# Patient Record
Sex: Male | Born: 1960
Health system: Southern US, Community
[De-identification: ages and names within clinical notes are randomized; demographics above are authoritative.]

## PROBLEM LIST (undated history)

## (undated) DIAGNOSIS — F329 Major depressive disorder, single episode, unspecified: Secondary | ICD-10-CM

## (undated) DIAGNOSIS — M810 Age-related osteoporosis without current pathological fracture: Secondary | ICD-10-CM

## (undated) DIAGNOSIS — G709 Myoneural disorder, unspecified: Secondary | ICD-10-CM

## (undated) DIAGNOSIS — K219 Gastro-esophageal reflux disease without esophagitis: Secondary | ICD-10-CM

## (undated) DIAGNOSIS — F32A Depression, unspecified: Secondary | ICD-10-CM

## (undated) HISTORY — DX: Depression, unspecified: F32.A

## (undated) HISTORY — DX: Gastro-esophageal reflux disease without esophagitis: K21.9

## (undated) HISTORY — DX: Age-related osteoporosis without current pathological fracture: M81.0

## (undated) HISTORY — DX: Myoneural disorder, unspecified: G70.9

## (undated) HISTORY — DX: Major depressive disorder, single episode, unspecified: F32.9

## (undated) HISTORY — PX: COLON SURGERY: SHX602

## (undated) HISTORY — PX: ROTATOR CUFF REPAIR: SHX139

---

## 2001-04-22 ENCOUNTER — Emergency Department (HOSPITAL_COMMUNITY): Admission: EM | Admit: 2001-04-22 | Discharge: 2001-04-22 | Payer: Self-pay

## 2001-07-01 ENCOUNTER — Emergency Department (HOSPITAL_COMMUNITY): Admission: EM | Admit: 2001-07-01 | Discharge: 2001-07-01 | Payer: Self-pay | Admitting: Emergency Medicine

## 2001-07-01 ENCOUNTER — Encounter: Payer: Self-pay | Admitting: Emergency Medicine

## 2002-02-23 ENCOUNTER — Emergency Department (HOSPITAL_COMMUNITY): Admission: EM | Admit: 2002-02-23 | Discharge: 2002-02-23 | Payer: Self-pay | Admitting: Emergency Medicine

## 2002-05-09 ENCOUNTER — Emergency Department (HOSPITAL_COMMUNITY): Admission: EM | Admit: 2002-05-09 | Discharge: 2002-05-09 | Payer: Self-pay | Admitting: *Deleted

## 2002-05-09 ENCOUNTER — Encounter: Payer: Self-pay | Admitting: *Deleted

## 2002-06-03 ENCOUNTER — Emergency Department (HOSPITAL_COMMUNITY): Admission: EM | Admit: 2002-06-03 | Discharge: 2002-06-03 | Payer: Self-pay | Admitting: Emergency Medicine

## 2002-08-07 ENCOUNTER — Ambulatory Visit (HOSPITAL_BASED_OUTPATIENT_CLINIC_OR_DEPARTMENT_OTHER): Admission: RE | Admit: 2002-08-07 | Discharge: 2002-08-07 | Payer: Self-pay | Admitting: Orthopedic Surgery

## 2002-08-08 ENCOUNTER — Ambulatory Visit (HOSPITAL_BASED_OUTPATIENT_CLINIC_OR_DEPARTMENT_OTHER): Admission: RE | Admit: 2002-08-08 | Discharge: 2002-08-09 | Payer: Self-pay | Admitting: Orthopedic Surgery

## 2004-11-24 ENCOUNTER — Ambulatory Visit: Payer: Self-pay | Admitting: Nurse Practitioner

## 2004-12-23 ENCOUNTER — Ambulatory Visit: Payer: Self-pay | Admitting: *Deleted

## 2004-12-30 ENCOUNTER — Emergency Department (HOSPITAL_COMMUNITY): Admission: EM | Admit: 2004-12-30 | Discharge: 2004-12-30 | Payer: Self-pay | Admitting: Emergency Medicine

## 2006-05-07 ENCOUNTER — Ambulatory Visit: Payer: Self-pay | Admitting: Nurse Practitioner

## 2006-11-27 ENCOUNTER — Encounter: Admission: RE | Admit: 2006-11-27 | Discharge: 2006-11-27 | Payer: Self-pay | Admitting: Family Medicine

## 2008-04-06 ENCOUNTER — Emergency Department (HOSPITAL_COMMUNITY): Admission: EM | Admit: 2008-04-06 | Discharge: 2008-04-06 | Payer: Self-pay | Admitting: Emergency Medicine

## 2008-08-21 ENCOUNTER — Encounter: Admission: RE | Admit: 2008-08-21 | Discharge: 2008-08-21 | Payer: Self-pay | Admitting: Family Medicine

## 2010-03-06 ENCOUNTER — Encounter: Payer: Self-pay | Admitting: Family Medicine

## 2010-03-21 ENCOUNTER — Ambulatory Visit: Payer: Self-pay | Admitting: Family Medicine

## 2010-07-01 NOTE — Op Note (Signed)
NAME:  Thomas Spence, Thomas Spence                      ACCOUNT NO.:  [id number]   MEDICAL RECORD NO.:  [id number]                   PATIENT TYPE:  AMB   LOCATION:  DSC                                  FACILITY:  [facility]   PHYSICIAN:  Loreta Ave, M.D.              DATE OF BIRTH:  [birth year]   DATE OF PROCEDURE:  DATE OF DISCHARGE:                                 OPERATIVE REPORT   PREOPERATIVE DIAGNOSES:  1. Traumatic injury right ankle with synovitis.  2. Osteochondral fracture medial talar dome.   POSTOPERATIVE DIAGNOSES:  1. Traumatic injury right ankle with synovitis.  2. Osteochondral fracture medial talar dome.  3. Healed fracture without chondral breakdown, but with traumatic synovitis,     anteromedial and a meniscoid lesion anterolateral.   PROCEDURE:  1. Right ankle exam under anesthesia including obtaining fluoroscopic stress     views.  2. Arthroscopy with assessment of talar dome.  3. Synovectomy and excision meniscoid lesion.   SURGEON:  Loreta Ave, M.D.   ASSISTANT:  __________, Rochel Brome.   ANESTHESIA:  General   BLOOD LOSS:  Minimal   TOURNIQUET TIME:  40 minutes   SPECIMENS:  None.   CULTURES:  None.   COMPLICATIONS:  None.   DRESSINGS:  Soft compression.   DESCRIPTION OF PROCEDURE:  The patient brought to the operating room, placed  on the operating table in the supine position.  After adequate anesthesia  had been obtained the right ankle examined.  Absolutely full motion.  No  instability to tilt and drawer with fluoroscopic guidance.   Tourniquet leg holder applied and leg prepped and draped in the usual  sterile fashion.  Exsanguinated with elevation and Esmarch.  Tourniquet  inflated to 250 mmHg.  Anteromedial and anterolateral ankle portals avoiding  neurovascular structures.  Ankle entered with blunt obturator, distended and  inspected with the arthroscope introduced.  Abundant, anteromedial synovitis  interposed between the tibial talar  joint.  All of this debrided.  Anterolaterally there was a very stout meniscoid lesion bridging from the  tibia to in front of the fibula causing impingement in that area.  Excised  in its entirety.  Once that was complete, the entire ankle examined.  The  area of osteochondral lesion on the medial talar dome had no intraarticular  extension.  There was no instability, no step off no chondral breakdown and  nothing to warrant in situ drilling, fixation, or further debridement.  All  recesses including the posterior aspect thoroughly examined.  No other  findings appreciated.  Complete full motion without soft tissue impingement  anteriorly at completion.   The instruments were totally removed.  Portals and ankle injected with  Marcaine.  Portals closed with 4-0 Nylon.  Sterile compressive dressing  applied.  Anesthesia reversed.  Brought to the recovery room.  Tolerated  surgery well with no complications.  Loreta Ave, M.D.    DFM/MEDQ  D:  08/08/2002  T:  08/09/2002  Job:  [id number]

## 2012-10-21 ENCOUNTER — Ambulatory Visit (INDEPENDENT_AMBULATORY_CARE_PROVIDER_SITE_OTHER): Payer: PRIVATE HEALTH INSURANCE | Admitting: Family Medicine

## 2012-10-21 VITALS — BP 124/88 | HR 76 | Temp 98.5°F | Resp 18 | Ht 74.0 in | Wt 230.0 lb

## 2012-10-21 DIAGNOSIS — I10 Essential (primary) hypertension: Secondary | ICD-10-CM | POA: Insufficient documentation

## 2012-10-21 DIAGNOSIS — G894 Chronic pain syndrome: Secondary | ICD-10-CM

## 2012-10-21 MED ORDER — HYDROCHLOROTHIAZIDE 12.5 MG PO TABS: 12.5000 mg | ORAL_TABLET | Freq: Every day | ORAL | Status: DC

## 2012-10-21 MED ORDER — DICLOFENAC SODIUM 75 MG PO TBEC: 75.0000 mg | DELAYED_RELEASE_TABLET | Freq: Two times a day (BID) | ORAL | Status: DC | PRN

## 2012-10-21 MED ORDER — OXYCODONE-ACETAMINOPHEN 5-325 MG PO TABS: 1.0000 | ORAL_TABLET | Freq: Every day | ORAL | Status: DC | PRN

## 2012-10-21 MED ORDER — TIZANIDINE HCL 4 MG PO TABS: 4.0000 mg | ORAL_TABLET | Freq: Three times a day (TID) | ORAL | Status: DC | PRN

## 2012-10-21 NOTE — Patient Instructions (Addendum)
We are going to try changing you to hydrochlorothiazide (HCTZ) for your blood pressure.  This may help with your swelling.   Keep an eye on your BP.  We are going to start on HCTZ 12.5 mg, but can increase to 25 if needed  We will work on getting you referred to a pain clinic to manage your chronic pain medications.  I will be in touch with your labs
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
## 2012-10-22 ENCOUNTER — Telehealth: Payer: Self-pay | Admitting: *Deleted

## 2012-10-22 ENCOUNTER — Encounter: Payer: Self-pay | Admitting: Family Medicine

## 2012-10-22 LAB — COMPREHENSIVE METABOLIC PANEL
Albumin: 3.9 g/dL (ref 3.5–5.2)
Alkaline Phosphatase: 59 U/L (ref 39–117)
BUN: 9 mg/dL (ref 6–23)
Creat: 1.2 mg/dL (ref 0.50–1.35)
Glucose, Bld: 92 mg/dL (ref 70–99)
Potassium: 4.6 mEq/L (ref 3.5–5.3)
Sodium: 141 mEq/L (ref 135–145)
Total Bilirubin: 0.8 mg/dL (ref 0.3–1.2)
Total Protein: 6.3 g/dL (ref 6.0–8.3)

## 2012-10-22 NOTE — Telephone Encounter (Signed)
Telephone for patient has been disconnected. Tried to call patient regarding recent normal lab results and to inform patient to switch to new blood pressure medication (HCTZ). Letter will be mailed out to patient explaining changes.

## 2012-11-08 ENCOUNTER — Other Ambulatory Visit: Payer: Self-pay | Admitting: Family Medicine

## 2012-11-14 ENCOUNTER — Other Ambulatory Visit: Payer: Self-pay | Admitting: Family Medicine

## 2012-11-22 ENCOUNTER — Ambulatory Visit (INDEPENDENT_AMBULATORY_CARE_PROVIDER_SITE_OTHER): Payer: PRIVATE HEALTH INSURANCE | Admitting: Family Medicine

## 2012-11-22 VITALS — BP 126/88 | HR 67 | Temp 98.2°F | Resp 16 | Ht 74.75 in | Wt 229.6 lb

## 2012-11-22 DIAGNOSIS — G894 Chronic pain syndrome: Secondary | ICD-10-CM

## 2012-11-22 DIAGNOSIS — I10 Essential (primary) hypertension: Secondary | ICD-10-CM

## 2012-11-22 MED ORDER — ALPRAZOLAM 1 MG PO TABS: 1.0000 mg | ORAL_TABLET | Freq: Every day | ORAL | Status: DC

## 2012-11-22 MED ORDER — TIZANIDINE HCL 4 MG PO TABS: 4.0000 mg | ORAL_TABLET | Freq: Three times a day (TID) | ORAL | Status: DC | PRN

## 2012-11-22 MED ORDER — OXYCODONE-ACETAMINOPHEN 5-325 MG PO TABS: 1.0000 | ORAL_TABLET | Freq: Every day | ORAL | Status: DC | PRN

## 2012-11-22 NOTE — Patient Instructions (Signed)
Good to see you again today.  Take care!

## 2012-11-22 NOTE — Progress Notes (Signed)
[facility] [address], [place] [address] [phone] 0000  Date:  11/22/2012   Name:  Thomas Spence   DOB:  06-15-1960   MRN:  [id number]  PCP:  No primary provider on file.    Chief Complaint: Back Pain, Neck Pain, Ankle Pain and Medication Refill   History of Present Illness:  Thomas Spence is a 52 y.o. very pleasant male patient who presents with the following:  Seen by myself one month ago to establish care after discharge from his prior PCP.  We changed from amlodipine to HCTZ after his last visit due to ankle swelling He plans to establish with the [facility] community care clinic and will see them early next month.  He continues to "have just the same chronic pain" in his ankles, knees and back and needs a RF of his oxycodone, xanax and zanaflex.   He did see improvement in his swelling when he changed to HCTZ from norvasc. His ankles do still hurt and crack sometimes.   His BP is looking good as well  He recently got over a cold- he is now feeling much better.   He did have [facility] flu shot at [facility] already this year per his report  Patient Active Problem List   Diagnosis Date Noted  . Hypertension 10/21/2012  . Chronic pain syndrome 10/21/2012    Past Medical History  Diagnosis Date  . Depression   . GERD (gastroesophageal reflux disease)   . Neuromuscular disorder   . Osteoporosis     Past Surgical History  Procedure Laterality Date  . Colon surgery    . Rotator cuff repair      History  Substance Use Topics  . Smoking status: Current Every Day Smoker -- 10.00 packs/day for 25 years    Types: Cigarettes  . Smokeless tobacco: Not on file  . Alcohol Use: No    Family History  Problem Relation Age of Onset  . Multiple sclerosis Mother   . Osteoporosis Mother   . Arthritis Mother     Rheumatoid  . Diabetes Father     Allergies  Allergen Reactions  . Losartan     Made my chest hurt    Medication list has been reviewed  and updated.  Current Outpatient Prescriptions on File Prior to Visit  Medication Sig Dispense Refill  . ALPRAZolam (XANAX) 1 MG tablet Take 1 mg by mouth at bedtime.      . diclofenac (VOLTAREN) 75 MG EC tablet Take 1 tablet (75 mg total) by mouth 2 (two) times daily as needed.  60 tablet  2  . hydrochlorothiazide (HYDRODIURIL) 12.5 MG tablet Take 1 tablet (12.5 mg total) by mouth daily. May take 25 mg a day if needed  60 tablet  3  . NEXIUM 40 MG capsule take 1 capsule by mouth once daily  30 capsule  2  . oxyCODONE-acetaminophen (PERCOCET/ROXICET) 5-325 MG per tablet Take 1 tablet by mouth daily as needed for pain.  30 tablet  0  . tiZANidine (ZANAFLEX) 4 MG tablet Take 1 tablet (4 mg total) by mouth every 8 (eight) hours as needed.  60 tablet  0   No current facility-administered medications on file prior to visit.    Review of Systems:  As per HPI- otherwise negative.   Physical Examination: Filed Vitals:   11/22/12 1051  BP: 126/88  Pulse: 67  Temp: 98.2 F (36.8 C)  Resp: 16   Filed Vitals:  11/22/12 1051  Height: 6' 2.75" (1.899 m)  Weight: 229 lb 9.6 oz (104.146 kg)   Body mass index is 28.88 kg/(m^2). Ideal Body Weight: Weight in (lb) to have BMI = 25: 198.3  GEN: WDWN, NAD, Non-toxic, A & O x 3 HEENT: Atraumatic, Normocephalic. Neck supple. No masses, No LAD. Ears and Nose: No external deformity. CV: RRR, No M/G/R. No JVD. No thrill. No extra heart sounds. PULM: CTA B, no wheezes, crackles, rhonchi. No retractions. No resp. distress. No accessory muscle use. ABD: S, NT, ND, +BS. No rebound. No HSM. EXTR: No c/c/e.  Ankles look great today NEURO Normal gait for him- he does use a cane PSYCH: Normally interactive. Conversant. Not depressed or anxious appearing.  Calm demeanor.    Assessment and Plan: Chronic pain syndrome - Plan: oxyCODONE-acetaminophen (PERCOCET/ROXICET) 5-325 MG per tablet, tiZANidine (ZANAFLEX) 4 MG tablet, ALPRAZolam (XANAX) 1 MG  tablet  HTN (hypertension)   BP looks fine on his new medication.  Recommended a BMP soon- he prefers to wait until next month because his ride home is waiting on him.   Refilled his chronic pain medications.    Signed Abbe Amsterdam, MD

## 2012-12-16 ENCOUNTER — Ambulatory Visit: Payer: Self-pay | Admitting: Internal Medicine

## 2012-12-23 ENCOUNTER — Ambulatory Visit (INDEPENDENT_AMBULATORY_CARE_PROVIDER_SITE_OTHER): Payer: Medicare Other | Admitting: Family Medicine

## 2012-12-23 VITALS — BP 118/70 | HR 80 | Temp 98.6°F | Resp 18 | Wt 229.0 lb

## 2012-12-23 DIAGNOSIS — G894 Chronic pain syndrome: Secondary | ICD-10-CM

## 2012-12-23 DIAGNOSIS — K044 Acute apical periodontitis of pulpal origin: Secondary | ICD-10-CM

## 2012-12-23 DIAGNOSIS — K047 Periapical abscess without sinus: Secondary | ICD-10-CM

## 2012-12-23 DIAGNOSIS — K029 Dental caries, unspecified: Secondary | ICD-10-CM

## 2012-12-23 MED ORDER — AMOXICILLIN 875 MG PO TABS: 875.0000 mg | ORAL_TABLET | Freq: Two times a day (BID) | ORAL | Status: DC

## 2012-12-23 MED ORDER — ALPRAZOLAM 1 MG PO TABS: 1.0000 mg | ORAL_TABLET | Freq: Every day | ORAL | Status: DC

## 2012-12-23 MED ORDER — OXYCODONE-ACETAMINOPHEN 5-325 MG PO TABS: 1.0000 | ORAL_TABLET | Freq: Every day | ORAL | Status: DC | PRN

## 2012-12-23 NOTE — Progress Notes (Signed)
Subjective: 52 year old male who is here for 2 things. He wants more pain and nerve medicines. Says he takes his pain pills one daily. Dr. Dallas Schimke last gave him some a month ago he says he is on these for chronic pain. He apparently is on disability, but when asked what he is on disability for he stumbled for an answer before applying he thought sports injuries. He hurts in his neck back and legs. He has been on disability since 2008. He also complains of dental pain in his left upper incisor. He says the pain comes and go but he thinks it is infected. It hurts in the dome above the tooth. He has had a bad tooth since he was incarcerated, having been released in 2000. He is scared of dentist.  Objective: Flat affect, somewhat overweight male in no acute distress. He has a broken off there are 2 on his left upper incisor. The rest of his teeth don't look too bad. He is tender in the gum margin above the tooth. Extensive exam not done at this time.  Assessment: Chronic pain syndrome with chronic controlled substance use Dental pain  Plan: Check his drug use history.  It would appear that he is been very consistent in getting just one prescription a month however our policy is not to be chronic pain medicine prescribers. I will explain that to him.  Will give him a round of antibiotics but he must go to a dentist before we do further dental related meds.

## 2012-12-23 NOTE — Patient Instructions (Signed)
[facility] Policy for Prescribing Controlled Substances (Revised 12/2011) 1. Prescriptions for controlled substances will be filled by ONE provider at [facility] with whom you have established and developed a plan for your care, including follow-up. 2. You are encouraged to schedule an appointment with your prescriber at our appointment center for follow-up visits whenever possible. 3. If you request a prescription for the controlled substance while at [facility] for an acute problem (with someone other than your regular prescriber), you MAY be given a ONE-TIME prescription for a 30-day supply of the controlled substance, to allow time for you to return to see your regular prescriber for additional prescriptions.   Please note the above policy statement from the practice regarding controlled substances. We are not a pain clinic, and will not continue prescribing regular narcotic medications. Because you have been taking them very consistently I will give him to you today, but we'll make a referral for a pain clinic. You will need to go to the pain clinic in the future for chronic pain medication management  Take the antibiotic one twice daily for your tooth. You need to see a dentist very soon.

## 2012-12-23 NOTE — Progress Notes (Deleted)
  Subjective:    Patient ID: Thomas Spence, male    DOB: [birth year], 52 y.o.   MRN: [id number]  HPI    Review of Systems     Objective:   Physical Exam        Assessment & Plan:

## 2013-01-07 ENCOUNTER — Ambulatory Visit: Payer: Self-pay | Admitting: Internal Medicine

## 2013-02-18 ENCOUNTER — Telehealth: Payer: Self-pay

## 2013-03-24 ENCOUNTER — Ambulatory Visit: Payer: PRIVATE HEALTH INSURANCE | Attending: Internal Medicine | Admitting: Internal Medicine

## 2013-03-24 ENCOUNTER — Encounter: Payer: Self-pay | Admitting: Internal Medicine

## 2013-03-24 VITALS — BP 133/84 | HR 99 | Temp 98.4°F | Resp 16 | Ht 76.0 in | Wt 226.0 lb

## 2013-03-24 DIAGNOSIS — M545 Low back pain, unspecified: Secondary | ICD-10-CM

## 2013-03-24 DIAGNOSIS — G8929 Other chronic pain: Secondary | ICD-10-CM

## 2013-03-24 DIAGNOSIS — Z23 Encounter for immunization: Secondary | ICD-10-CM

## 2013-03-24 DIAGNOSIS — F411 Generalized anxiety disorder: Secondary | ICD-10-CM

## 2013-03-24 DIAGNOSIS — Z139 Encounter for screening, unspecified: Secondary | ICD-10-CM

## 2013-03-24 DIAGNOSIS — Z008 Encounter for other general examination: Secondary | ICD-10-CM | POA: Insufficient documentation

## 2013-03-24 DIAGNOSIS — I10 Essential (primary) hypertension: Secondary | ICD-10-CM

## 2013-03-24 DIAGNOSIS — K219 Gastro-esophageal reflux disease without esophagitis: Secondary | ICD-10-CM

## 2013-03-24 DIAGNOSIS — G709 Myoneural disorder, unspecified: Secondary | ICD-10-CM | POA: Insufficient documentation

## 2013-03-24 DIAGNOSIS — F3289 Other specified depressive episodes: Secondary | ICD-10-CM | POA: Insufficient documentation

## 2013-03-24 DIAGNOSIS — G894 Chronic pain syndrome: Secondary | ICD-10-CM

## 2013-03-24 DIAGNOSIS — Z79899 Other long term (current) drug therapy: Secondary | ICD-10-CM | POA: Insufficient documentation

## 2013-03-24 DIAGNOSIS — F17200 Nicotine dependence, unspecified, uncomplicated: Secondary | ICD-10-CM | POA: Insufficient documentation

## 2013-03-24 DIAGNOSIS — F329 Major depressive disorder, single episode, unspecified: Secondary | ICD-10-CM | POA: Insufficient documentation

## 2013-03-24 LAB — CBC WITH DIFFERENTIAL/PLATELET
BASOS ABS: 0 10*3/uL (ref 0.0–0.1)
BASOS PCT: 0 % (ref 0–1)
EOS ABS: 0.1 10*3/uL (ref 0.0–0.7)
EOS PCT: 1 % (ref 0–5)
HCT: 42.5 % (ref 39.0–52.0)
Hemoglobin: 15.4 g/dL (ref 13.0–17.0)
LYMPHS PCT: 27 % (ref 12–46)
Lymphs Abs: 2.8 10*3/uL (ref 0.7–4.0)
MCH: 32.7 pg (ref 26.0–34.0)
MCHC: 36.2 g/dL — ABNORMAL HIGH (ref 30.0–36.0)
MCV: 90.2 fL (ref 78.0–100.0)
MONO ABS: 0.4 10*3/uL (ref 0.1–1.0)
Monocytes Relative: 4 % (ref 3–12)
NEUTROS PCT: 68 % (ref 43–77)
Neutro Abs: 7 10*3/uL (ref 1.7–7.7)
PLATELETS: 279 10*3/uL (ref 150–400)
RBC: 4.71 MIL/uL (ref 4.22–5.81)
RDW: 13.9 % (ref 11.5–15.5)
WBC: 10.3 10*3/uL (ref 4.0–10.5)

## 2013-03-24 LAB — LIPID PANEL
CHOL/HDL RATIO: 3.4 ratio
Cholesterol: 203 mg/dL — ABNORMAL HIGH (ref 0–200)
HDL: 60 mg/dL (ref >39)
LDL CALC: 118 mg/dL — AB (ref 0–99)
Triglycerides: 125 mg/dL (ref <150)
VLDL: 25 mg/dL (ref 0–40)

## 2013-03-24 LAB — COMPLETE METABOLIC PANEL WITH GFR
ALK PHOS: 52 U/L (ref 39–117)
ALT: 45 U/L (ref 0–53)
AST: 55 U/L — AB (ref 0–37)
Albumin: 4 g/dL (ref 3.5–5.2)
BUN: 10 mg/dL (ref 6–23)
CALCIUM: 8.7 mg/dL (ref 8.4–10.5)
CO2: 26 mEq/L (ref 19–32)
CREATININE: 1.17 mg/dL (ref 0.50–1.35)
Chloride: 104 mEq/L (ref 96–112)
GFR, EST NON AFRICAN AMERICAN: 72 mL/min
GFR, Est African American: 83 mL/min
Glucose, Bld: 89 mg/dL (ref 70–99)
Potassium: 3.8 mEq/L (ref 3.5–5.3)
Sodium: 138 mEq/L (ref 135–145)
Total Bilirubin: 0.7 mg/dL (ref 0.2–1.2)
Total Protein: 6.3 g/dL (ref 6.0–8.3)

## 2013-03-24 LAB — TSH: TSH: 0.678 u[IU]/mL (ref 0.350–4.500)

## 2013-03-24 MED ORDER — TRAMADOL HCL 50 MG PO TABS: 50.0000 mg | ORAL_TABLET | Freq: Three times a day (TID) | ORAL | Status: DC | PRN

## 2013-03-24 MED ORDER — PANTOPRAZOLE SODIUM 40 MG PO TBEC: 40.0000 mg | DELAYED_RELEASE_TABLET | Freq: Every day | ORAL | Status: DC

## 2013-03-24 MED ORDER — HYDROCHLOROTHIAZIDE 12.5 MG PO TABS: 12.5000 mg | ORAL_TABLET | Freq: Every day | ORAL | Status: DC

## 2013-03-24 NOTE — Progress Notes (Signed)
Patient Demographics  Thomas Spence, is a 53 y.o. male  WUJ:[id number]  NFA:[id number]  DOB - 1960-12-24  CC:  Chief Complaint  Patient presents with  . Establish Care  . Hypertension  . Gastrophageal Reflux       HPI: Thomas Spence is a 53 y.o. male here today to establish medical care. Patient has history of hypertension GERD chronic pain syndrome low back pain, anxiety/depression following up with the psychiatrist, patient is requesting refill on his blood pressure medication also asking for pain medication, has been using diclofenac and ibuprofen, denies any fever chills chest pain shortness of breath denies any numbness weakness. Patient has No headache, No chest pain, No abdominal pain - No Nausea, No new weakness tingling or numbness, No Cough - SOB.  Allergies  Allergen Reactions  . Losartan     Made my chest hurt   Past Medical History  Diagnosis Date  . Depression   . GERD (gastroesophageal reflux disease)   . Neuromuscular disorder   . Osteoporosis    Current Outpatient Prescriptions on File Prior to Visit  Medication Sig Dispense Refill  . ALPRAZolam (XANAX) 1 MG tablet Take 1 tablet (1 mg total) by mouth at bedtime.  30 tablet  0  . amoxicillin (AMOXIL) 875 MG tablet Take 1 tablet (875 mg total) by mouth 2 (two) times daily.  14 tablet  0  . diclofenac (VOLTAREN) 75 MG EC tablet Take 1 tablet (75 mg total) by mouth 2 (two) times daily as needed.  60 tablet  2  . oxyCODONE-acetaminophen (PERCOCET/ROXICET) 5-325 MG per tablet Take 1 tablet by mouth daily as needed.  30 tablet  0  . tiZANidine (ZANAFLEX) 4 MG tablet Take 1 tablet (4 mg total) by mouth every 8 (eight) hours as needed.  60 tablet  0   No current facility-administered medications on file prior to visit.   Family History  Problem Relation Age of Onset  . Multiple sclerosis Mother   . Osteoporosis Mother   . Arthritis Mother     Rheumatoid  . Hypertension Mother   . Hyperlipidemia  Mother   . Diabetes Father   . Hypertension Father   . Hyperlipidemia Father    History   Social History  . Marital Status: Divorced    Spouse Name: N/A    Number of Children: N/A  . Years of Education: N/A   Occupational History  . Not on file.   Social History Main Topics  . Smoking status: Current Every Day Smoker -- 10.00 packs/day for 25 years    Types: Cigarettes  . Smokeless tobacco: Not on file     Comment: states he cut back to 2 cigarettes/week  . Alcohol Use: No  . Drug Use: No  . Sexual Activity: Not on file   Other Topics Concern  . Not on file   Social History Narrative  . No narrative on file    Review of Systems: Constitutional: Negative for fever, chills, diaphoresis, activity change, appetite change and fatigue. HENT: Negative for ear pain, nosebleeds, congestion, facial swelling, rhinorrhea, neck pain, neck stiffness and ear discharge.  Eyes: Negative for pain, discharge, redness, itching and visual disturbance. Respiratory: Negative for cough, choking, chest tightness, shortness of breath, wheezing and stridor.  Cardiovascular: Negative for chest pain, palpitations and leg swelling. Gastrointestinal: Negative for abdominal distention. Genitourinary: Negative for dysuria, urgency, frequency, hematuria, flank pain, decreased urine volume, difficulty urinating and dyspareunia.  Musculoskeletal: Positive for back pain, joint  swelling, arthralgia and gait problem. Neurological: Negative for dizziness, tremors, seizures, syncope, facial asymmetry, speech difficulty, weakness, light-headedness, numbness and headaches.  Hematological: Negative for adenopathy. Does not bruise/bleed easily. Psychiatric/Behavioral: Negative for hallucinations, behavioral problems, confusion, dysphoric mood, decreased concentration and agitation.    Objective:   Filed Vitals:   03/24/13 1029  BP: 133/84  Pulse: 99  Temp: 98.4 F (36.9 C)  Resp: 16    Physical  Exam: Constitutional: Patient appears well-developed and well-nourished. No distress. HENT: Normocephalic, atraumatic, External right and left ear normal. Oropharynx is clear and moist.  Eyes: Conjunctivae and EOM are normal. PERRLA, no scleral icterus. Neck: Normal ROM. Neck supple. No JVD. No tracheal deviation. No thyromegaly. CVS: RRR, S1/S2 +, no murmurs, no gallops, no carotid bruit.  Pulmonary: Effort and breath sounds normal, no stridor, rhonchi, wheezes, rales.  Abdominal: Soft. BS +, no distension, tenderness, rebound or guarding.  Musculoskeletal: Normal range of motion. No edema , minimal lower spinal enderness. Equal strength in both extremities.  Neuro: Alert. Normal reflexes, muscle tone coordination. No cranial nerve deficit. Skin: Skin is warm and dry. No rash noted. Not diaphoretic. No erythema. No pallor. Psychiatric: Normal mood and affect. Behavior, judgment, thought content normal.  No results found for this basename: WBC,  HGB,  HCT,  MCV,  PLT   Lab Results  Component Value Date   CREATININE 1.20 10/21/2012   BUN 9 10/21/2012   NA 141 10/21/2012   K 4.6 10/21/2012   CL 106 10/21/2012   CO2 28 10/21/2012    No results found for this basename: HGBA1C   Lipid Panel  No results found for this basename: chol,  trig,  hdl,  cholhdl,  vldl,  ldlcalc       Assessment and plan:   1. Hypertension Continue with hydrochlorothiazide. - COMPLETE METABOLIC PANEL WITH GFR  2. Chronic pain syndrome/Chronic low back pain  - traMADol (ULTRAM) 50 MG tablet; Take 1 tablet (50 mg total) by mouth every 8 (eight) hours as needed for moderate pain.  Dispense: 60 tablet; Refill: 0 - Ambulatory referral to Pain Clinic  3. GERD (gastroesophageal reflux disease) Started patient on Protonix, also advised for lifestyle modification.. - pantoprazole (PROTONIX) 40 MG tablet; Take 1 tablet (40 mg total) by mouth daily.   Dispense: 30 tablet; Refill: 3  4. Anxiety state, unspecified Patient  is following up with psychiatrist.  6. Need for Tdap vaccination  7. Needs flu shot  8. Screening  - CBC with Differential - TSH - Lipid panel - Vit D  25 hydroxy (rtn osteoporosis monitoring)       Health Maintenance -Colonoscopy: Patient is up-to-date  -Vaccinations:  -TdAP  -Influenza  Return in about 6 weeks (around 05/05/2013).  Doris CheadleADVANI, Zanetta Dehaan, MD

## 2013-03-24 NOTE — Progress Notes (Signed)
Pt here to establish care Hx HTN,GERD and chronic pain Walks with cane assist Taking HCTZ 12.5 mg daily Need T-Dap

## 2013-03-25 LAB — VITAMIN D 25 HYDROXY (VIT D DEFICIENCY, FRACTURES): VIT D 25 HYDROXY: 20 ng/mL — AB (ref 30–89)

## 2013-03-28 ENCOUNTER — Other Ambulatory Visit: Payer: Self-pay | Admitting: Family Medicine

## 2013-03-28 DIAGNOSIS — G4700 Insomnia, unspecified: Secondary | ICD-10-CM

## 2013-05-05 ENCOUNTER — Ambulatory Visit: Payer: Medicare Other | Admitting: Internal Medicine

## 2013-07-09 ENCOUNTER — Ambulatory Visit: Payer: PRIVATE HEALTH INSURANCE | Admitting: Internal Medicine

## 2013-09-01 ENCOUNTER — Ambulatory Visit: Payer: PRIVATE HEALTH INSURANCE | Admitting: Internal Medicine

## 2013-09-22 ENCOUNTER — Other Ambulatory Visit: Payer: Self-pay | Admitting: *Deleted

## 2013-09-22 ENCOUNTER — Other Ambulatory Visit: Payer: Self-pay

## 2013-09-22 DIAGNOSIS — K219 Gastro-esophageal reflux disease without esophagitis: Secondary | ICD-10-CM

## 2013-09-22 MED ORDER — PANTOPRAZOLE SODIUM 40 MG PO TBEC: 40.0000 mg | DELAYED_RELEASE_TABLET | Freq: Every day | ORAL | Status: DC

## 2013-09-22 NOTE — Telephone Encounter (Signed)
protonix called into [facility].

## 2013-09-26 ENCOUNTER — Ambulatory Visit: Payer: PRIVATE HEALTH INSURANCE | Admitting: Internal Medicine

## 2013-11-11 ENCOUNTER — Encounter: Payer: Self-pay | Admitting: Internal Medicine

## 2013-11-11 ENCOUNTER — Ambulatory Visit: Payer: PRIVATE HEALTH INSURANCE | Attending: Internal Medicine | Admitting: Internal Medicine

## 2013-11-11 VITALS — BP 130/80 | HR 77 | Temp 98.0°F | Resp 16 | Wt 216.0 lb

## 2013-11-11 DIAGNOSIS — K219 Gastro-esophageal reflux disease without esophagitis: Secondary | ICD-10-CM | POA: Diagnosis not present

## 2013-11-11 DIAGNOSIS — F3289 Other specified depressive episodes: Secondary | ICD-10-CM | POA: Insufficient documentation

## 2013-11-11 DIAGNOSIS — G894 Chronic pain syndrome: Secondary | ICD-10-CM | POA: Diagnosis not present

## 2013-11-11 DIAGNOSIS — F329 Major depressive disorder, single episode, unspecified: Secondary | ICD-10-CM | POA: Insufficient documentation

## 2013-11-11 DIAGNOSIS — Z79899 Other long term (current) drug therapy: Secondary | ICD-10-CM | POA: Insufficient documentation

## 2013-11-11 DIAGNOSIS — F17200 Nicotine dependence, unspecified, uncomplicated: Secondary | ICD-10-CM | POA: Insufficient documentation

## 2013-11-11 DIAGNOSIS — I10 Essential (primary) hypertension: Secondary | ICD-10-CM

## 2013-11-11 DIAGNOSIS — Z23 Encounter for immunization: Secondary | ICD-10-CM | POA: Diagnosis not present

## 2013-11-11 DIAGNOSIS — G709 Myoneural disorder, unspecified: Secondary | ICD-10-CM | POA: Insufficient documentation

## 2013-11-11 DIAGNOSIS — Z833 Family history of diabetes mellitus: Secondary | ICD-10-CM | POA: Diagnosis not present

## 2013-11-11 DIAGNOSIS — Z791 Long term (current) use of non-steroidal anti-inflammatories (NSAID): Secondary | ICD-10-CM | POA: Diagnosis not present

## 2013-11-11 LAB — COMPLETE METABOLIC PANEL WITH GFR
ALK PHOS: 57 U/L (ref 39–117)
ALT: 15 U/L (ref 0–53)
AST: 14 U/L (ref 0–37)
Albumin: 4.3 g/dL (ref 3.5–5.2)
BUN: 11 mg/dL (ref 6–23)
CHLORIDE: 107 meq/L (ref 96–112)
CO2: 23 mEq/L (ref 19–32)
Calcium: 9.5 mg/dL (ref 8.4–10.5)
Creat: 1.52 mg/dL — ABNORMAL HIGH (ref 0.50–1.35)
GFR, Est African American: 60 mL/min
GFR, Est Non African American: 52 mL/min — ABNORMAL LOW
Glucose, Bld: 68 mg/dL — ABNORMAL LOW (ref 70–99)
POTASSIUM: 4.8 meq/L (ref 3.5–5.3)
SODIUM: 143 meq/L (ref 135–145)
TOTAL PROTEIN: 6.8 g/dL (ref 6.0–8.3)
Total Bilirubin: 0.7 mg/dL (ref 0.2–1.2)

## 2013-11-11 LAB — HEMOGLOBIN A1C
Hgb A1c MFr Bld: 5.6 % (ref <5.7)
Mean Plasma Glucose: 114 mg/dL (ref <117)

## 2013-11-11 MED ORDER — GABAPENTIN 100 MG PO CAPS: 100.0000 mg | ORAL_CAPSULE | Freq: Three times a day (TID) | ORAL | Status: DC

## 2013-11-11 NOTE — Progress Notes (Signed)
Patient here for follow up on his HTN and gerd 

## 2013-11-11 NOTE — Patient Instructions (Signed)
DASH Eating Plan °DASH stands for "Dietary Approaches to Stop Hypertension." The DASH eating plan is a healthy eating plan that has been shown to reduce high blood pressure (hypertension). Additional health benefits may include reducing the risk of type 2 diabetes mellitus, heart disease, and stroke. The DASH eating plan may also help with weight loss. °WHAT DO I NEED TO KNOW ABOUT THE DASH EATING PLAN? °For the DASH eating plan, you will follow these general guidelines: °· Choose foods with a percent daily value for sodium of less than 5% (as listed on the food label). °· Use salt-free seasonings or herbs instead of table salt or sea salt. °· Check with your health care provider or pharmacist before using salt substitutes. °· Eat lower-sodium products, often labeled as "lower sodium" or "no salt added." °· Eat fresh foods. °· Eat more vegetables, fruits, and low-fat dairy products. °· Choose whole grains. Look for the word "whole" as the first word in the ingredient list. °· Choose fish and skinless chicken or turkey more often than red meat. Limit fish, poultry, and meat to 6 oz (170 g) each day. °· Limit sweets, desserts, sugars, and sugary drinks. °· Choose heart-healthy fats. °· Limit cheese to 1 oz (28 g) per day. °· Eat more home-cooked food and less restaurant, buffet, and fast food. °· Limit fried foods. °· Cook foods using methods other than frying. °· Limit canned vegetables. If you do use them, rinse them well to decrease the sodium. °· When eating at a restaurant, ask that your food be prepared with less salt, or no salt if possible. °WHAT FOODS CAN I EAT? °Seek help from a dietitian for individual calorie needs. °Grains °Whole grain or whole wheat bread. Brown rice. Whole grain or whole wheat pasta. Quinoa, bulgur, and whole grain cereals. Low-sodium cereals. Corn or whole wheat flour tortillas. Whole grain cornbread. Whole grain crackers. Low-sodium crackers. °Vegetables °Fresh or frozen vegetables  (raw, steamed, roasted, or grilled). Low-sodium or reduced-sodium tomato and vegetable juices. Low-sodium or reduced-sodium tomato sauce and paste. Low-sodium or reduced-sodium canned vegetables.  °Fruits °All fresh, canned (in natural juice), or frozen fruits. °Meat and Other Protein Products °Ground beef (85% or leaner), grass-fed beef, or beef trimmed of fat. Skinless chicken or turkey. Ground chicken or turkey. Pork trimmed of fat. All fish and seafood. Eggs. Dried beans, peas, or lentils. Unsalted nuts and seeds. Unsalted canned beans. °Dairy °Low-fat dairy products, such as skim or 1% milk, 2% or reduced-fat cheeses, low-fat ricotta or cottage cheese, or plain low-fat yogurt. Low-sodium or reduced-sodium cheeses. °Fats and Oils °Tub margarines without trans fats. Light or reduced-fat mayonnaise and salad dressings (reduced sodium). Avocado. Safflower, olive, or canola oils. Natural peanut or almond butter. °Other °Unsalted popcorn and pretzels. °The items listed above may not be a complete list of recommended foods or beverages. Contact your dietitian for more options. °WHAT FOODS ARE NOT RECOMMENDED? °Grains °White bread. White pasta. White rice. Refined cornbread. Bagels and croissants. Crackers that contain trans fat. °Vegetables °Creamed or fried vegetables. Vegetables in a cheese sauce. Regular canned vegetables. Regular canned tomato sauce and paste. Regular tomato and vegetable juices. °Fruits °Dried fruits. Canned fruit in light or heavy syrup. Fruit juice. °Meat and Other Protein Products °Fatty cuts of meat. Ribs, chicken wings, bacon, sausage, bologna, salami, chitterlings, fatback, hot dogs, bratwurst, and packaged luncheon meats. Salted nuts and seeds. Canned beans with salt. °Dairy °Whole or 2% milk, cream, half-and-half, and cream cheese. Whole-fat or sweetened yogurt. Full-fat   cheeses or blue cheese. Nondairy creamers and whipped toppings. Processed cheese, cheese spreads, or cheese  curds. °Condiments °Onion and garlic salt, seasoned salt, table salt, and sea salt. Canned and packaged gravies. Worcestershire sauce. Tartar sauce. Barbecue sauce. Teriyaki sauce. Soy sauce, including reduced sodium. Steak sauce. Fish sauce. Oyster sauce. Cocktail sauce. Horseradish. Ketchup and mustard. Meat flavorings and tenderizers. Bouillon cubes. Hot sauce. Tabasco sauce. Marinades. Taco seasonings. Relishes. °Fats and Oils °Butter, stick margarine, lard, shortening, ghee, and bacon fat. Coconut, palm kernel, or palm oils. Regular salad dressings. °Other °Pickles and olives. Salted popcorn and pretzels. °The items listed above may not be a complete list of foods and beverages to avoid. Contact your dietitian for more information. °WHERE CAN I FIND MORE INFORMATION? °National Heart, Lung, and Blood Institute: www.nhlbi.nih.gov/health/health-topics/topics/dash/ °Document Released: 01/19/2011 Document Revised: 06/16/2013 Document Reviewed: 12/04/2012 °ExitCare® Patient Information ©2015 [employer], [employer]. This information is not intended to replace advice given to you by your health care provider. Make sure you discuss any questions you have with your health care provider. ° °

## 2013-11-11 NOTE — Progress Notes (Signed)
MRN: [id number] Name: Thomas GeorgesMaurice D Criss  Sex: male Age: 53 y.o. DOB: Aug 19, 1960  Allergies: Losartan  Chief Complaint  Patient presents with  . Follow-up    HPI: Patient is 53 y.o. male who has history of hypertension, GERD, chronic lower back pain comes today for followup, patient has followed  after several months, currently denies any headache dizziness chest pain or shortness of breath, taking hydrochlorothiazide 12.5 mg daily as well as Protonix, is requesting some pain medication, patient also history of anxiety and was following up with the psychiatrist. Patient has family history of diabetes and wants to be checked, patient will like to have a filter today.   Past Medical History  Diagnosis Date  . Depression   . GERD (gastroesophageal reflux disease)   . Neuromuscular disorder   . Osteoporosis     Past Surgical History  Procedure Laterality Date  . Colon surgery    . Rotator cuff repair        Medication List       This list is accurate as of: 11/11/13 11:35 AM.  Always use your most recent med list.               ALPRAZolam 1 MG tablet  Commonly known as:  XANAX  take 1 tablet by mouth at bedtime     amoxicillin 875 MG tablet  Commonly known as:  AMOXIL  Take 1 tablet (875 mg total) by mouth 2 (two) times daily.     diclofenac 75 MG EC tablet  Commonly known as:  VOLTAREN  Take 1 tablet (75 mg total) by mouth 2 (two) times daily as needed.     gabapentin 100 MG capsule  Commonly known as:  NEURONTIN  Take 1 capsule (100 mg total) by mouth 3 (three) times daily.     hydrochlorothiazide 12.5 MG tablet  Commonly known as:  HYDRODIURIL  Take 1 tablet (12.5 mg total) by mouth daily. May take 25 mg a day if needed     oxyCODONE-acetaminophen 5-325 MG per tablet  Commonly known as:  PERCOCET/ROXICET  Take 1 tablet by mouth daily as needed.     pantoprazole 40 MG tablet  Commonly known as:  PROTONIX  Take 1 tablet (40 mg total) by mouth daily.  Switch for any other PPI at similar dose and frequency     tiZANidine 4 MG tablet  Commonly known as:  ZANAFLEX  Take 1 tablet (4 mg total) by mouth every 8 (eight) hours as needed.     traMADol 50 MG tablet  Commonly known as:  ULTRAM  Take 1 tablet (50 mg total) by mouth every 8 (eight) hours as needed for moderate pain.        Meds ordered this encounter  Medications  . gabapentin (NEURONTIN) 100 MG capsule    Sig: Take 1 capsule (100 mg total) by mouth 3 (three) times daily.    Dispense:  90 capsule    Refill:  3    Immunization History  Administered Date(s) Administered  . Influenza,inj,Quad PF,36+ Mos 11/11/2013  . Tdap 03/24/2013    Family History  Problem Relation Age of Onset  . Multiple sclerosis Mother   . Osteoporosis Mother   . Arthritis Mother     Rheumatoid  . Hypertension Mother   . Hyperlipidemia Mother   . Diabetes Father   . Hypertension Father   . Hyperlipidemia Father     History  Substance Use Topics  . Smoking status:  Current Every Day Smoker -- 10.00 packs/day for 25 years    Types: Cigarettes  . Smokeless tobacco: Not on file     Comment: states he cut back to 2 cigarettes/week  . Alcohol Use: No    Review of Systems   As noted in HPI  Filed Vitals:   11/11/13 1110  BP: 130/80  Pulse:   Temp:   Resp:     Physical Exam  Physical Exam  Constitutional: No distress.  Eyes: EOM are normal. Pupils are equal, round, and reactive to light.  Cardiovascular: Normal rate and regular rhythm.   Pulmonary/Chest: Breath sounds normal. No respiratory distress. He has no wheezes. He has no rales.  Musculoskeletal:  Lower lumbar paraspinal tenderness     CBC    Component Value Date/Time   WBC 10.3 03/24/2013 1057   RBC 4.71 03/24/2013 1057   HGB 15.4 03/24/2013 1057   HCT 42.5 03/24/2013 1057   PLT 279 03/24/2013 1057   MCV 90.2 03/24/2013 1057   LYMPHSABS 2.8 03/24/2013 1057   MONOABS 0.4 03/24/2013 1057   EOSABS 0.1 03/24/2013 1057    BASOSABS 0.0 03/24/2013 1057    CMP     Component Value Date/Time   NA 138 03/24/2013 1057   K 3.8 03/24/2013 1057   CL 104 03/24/2013 1057   CO2 26 03/24/2013 1057   GLUCOSE 89 03/24/2013 1057   BUN 10 03/24/2013 1057   CREATININE 1.17 03/24/2013 1057   CALCIUM 8.7 03/24/2013 1057   PROT 6.3 03/24/2013 1057   ALBUMIN 4.0 03/24/2013 1057   AST 55* 03/24/2013 1057   ALT 45 03/24/2013 1057   ALKPHOS 52 03/24/2013 1057   BILITOT 0.7 03/24/2013 1057   GFRNONAA 72 03/24/2013 1057   GFRAA 83 03/24/2013 1057    Lab Results  Component Value Date/Time   CHOL 203* 03/24/2013 10:57 AM    No components found with this basename: hga1c    Lab Results  Component Value Date/Time   AST 55* 03/24/2013 10:57 AM    Assessment and Plan  Essential hypertension - Plan: Blood pressure is well controlled, continue with hydrochlorothiazide, will repeat her COMPLETE METABOLIC PANEL WITH GFR  Gastroesophageal reflux disease, esophagitis presence not specified Lifestyle modification, continue with Protonix.  Chronic pain syndrome - Plan: Trial of gabapentin (NEURONTIN) 100 MG capsule  Family history of diabetes mellitus (DM) - Plan: Hemoglobin A1c   Health Maintenance   -Influenza shot given today   Return in about 3 months (around 02/10/2014) for hypertension.  Doris Cheadle, MD

## 2014-01-14 ENCOUNTER — Telehealth: Payer: Self-pay | Admitting: Internal Medicine

## 2014-01-14 NOTE — Telephone Encounter (Signed)
Pt requesting refill for bp meds sent to [facility] on [address]. Please f/u with pt.

## 2014-01-22 ENCOUNTER — Ambulatory Visit: Payer: PRIVATE HEALTH INSURANCE | Admitting: Internal Medicine

## 2014-02-04 ENCOUNTER — Other Ambulatory Visit: Payer: Self-pay | Admitting: Emergency Medicine

## 2014-02-04 DIAGNOSIS — I10 Essential (primary) hypertension: Secondary | ICD-10-CM

## 2014-02-04 MED ORDER — HYDROCHLOROTHIAZIDE 12.5 MG PO TABS: 12.5000 mg | ORAL_TABLET | Freq: Every day | ORAL | Status: DC

## 2014-02-17 ENCOUNTER — Ambulatory Visit: Payer: PRIVATE HEALTH INSURANCE | Admitting: Internal Medicine

## 2014-02-26 ENCOUNTER — Ambulatory Visit: Payer: Medicaid Other | Admitting: Internal Medicine

## 2014-05-19 ENCOUNTER — Ambulatory Visit: Payer: Medicaid Other | Admitting: Internal Medicine

## 2014-11-13 DIAGNOSIS — M659 Synovitis and tenosynovitis, unspecified: Secondary | ICD-10-CM | POA: Diagnosis not present

## 2014-11-13 DIAGNOSIS — M25571 Pain in right ankle and joints of right foot: Secondary | ICD-10-CM | POA: Diagnosis not present

## 2014-11-13 DIAGNOSIS — M542 Cervicalgia: Secondary | ICD-10-CM | POA: Diagnosis not present

## 2014-11-13 DIAGNOSIS — M4722 Other spondylosis with radiculopathy, cervical region: Secondary | ICD-10-CM | POA: Diagnosis not present

## 2016-03-17 ENCOUNTER — Ambulatory Visit: Payer: Medicaid Other | Admitting: Family Medicine

## 2016-03-30 ENCOUNTER — Encounter: Payer: Self-pay | Admitting: Licensed Clinical Social Worker

## 2016-03-30 ENCOUNTER — Ambulatory Visit: Payer: Medicare Other | Attending: Family Medicine | Admitting: Family Medicine

## 2016-03-30 VITALS — BP 142/94 | HR 78 | Temp 99.0°F | Resp 18 | Ht 75.0 in | Wt 235.6 lb

## 2016-03-30 DIAGNOSIS — Z118 Encounter for screening for other infectious and parasitic diseases: Secondary | ICD-10-CM | POA: Diagnosis not present

## 2016-03-30 DIAGNOSIS — Z79899 Other long term (current) drug therapy: Secondary | ICD-10-CM | POA: Diagnosis not present

## 2016-03-30 DIAGNOSIS — F5101 Primary insomnia: Secondary | ICD-10-CM

## 2016-03-30 DIAGNOSIS — K219 Gastro-esophageal reflux disease without esophagitis: Secondary | ICD-10-CM | POA: Insufficient documentation

## 2016-03-30 DIAGNOSIS — Z87891 Personal history of nicotine dependence: Secondary | ICD-10-CM

## 2016-03-30 DIAGNOSIS — I10 Essential (primary) hypertension: Secondary | ICD-10-CM | POA: Insufficient documentation

## 2016-03-30 DIAGNOSIS — Z23 Encounter for immunization: Secondary | ICD-10-CM | POA: Insufficient documentation

## 2016-03-30 DIAGNOSIS — F419 Anxiety disorder, unspecified: Secondary | ICD-10-CM | POA: Diagnosis not present

## 2016-03-30 DIAGNOSIS — Z114 Encounter for screening for human immunodeficiency virus [HIV]: Secondary | ICD-10-CM | POA: Insufficient documentation

## 2016-03-30 DIAGNOSIS — Z0000 Encounter for general adult medical examination without abnormal findings: Secondary | ICD-10-CM

## 2016-03-30 DIAGNOSIS — F329 Major depressive disorder, single episode, unspecified: Secondary | ICD-10-CM | POA: Diagnosis not present

## 2016-03-30 DIAGNOSIS — F32A Depression, unspecified: Secondary | ICD-10-CM

## 2016-03-30 DIAGNOSIS — M25572 Pain in left ankle and joints of left foot: Secondary | ICD-10-CM | POA: Diagnosis not present

## 2016-03-30 DIAGNOSIS — G8929 Other chronic pain: Secondary | ICD-10-CM

## 2016-03-30 DIAGNOSIS — G4700 Insomnia, unspecified: Secondary | ICD-10-CM | POA: Diagnosis not present

## 2016-03-30 DIAGNOSIS — M25571 Pain in right ankle and joints of right foot: Secondary | ICD-10-CM | POA: Insufficient documentation

## 2016-03-30 DIAGNOSIS — R29898 Other symptoms and signs involving the musculoskeletal system: Secondary | ICD-10-CM

## 2016-03-30 LAB — BASIC METABOLIC PANEL WITH GFR
BUN: 15 mg/dL (ref 7–25)
CHLORIDE: 107 mmol/L (ref 98–110)
CO2: 22 mmol/L (ref 20–31)
CREATININE: 1.16 mg/dL (ref 0.70–1.33)
Calcium: 9.5 mg/dL (ref 8.6–10.3)
GFR, EST AFRICAN AMERICAN: 82 mL/min (ref >=60)
GFR, Est Non African American: 71 mL/min (ref >=60)
Glucose, Bld: 93 mg/dL (ref 65–99)
POTASSIUM: 4.5 mmol/L (ref 3.5–5.3)
SODIUM: 139 mmol/L (ref 135–146)

## 2016-03-30 LAB — HIV ANTIBODY (ROUTINE TESTING W REFLEX): HIV: NONREACTIVE

## 2016-03-30 MED ORDER — DICLOFENAC SODIUM 75 MG PO TBEC: 75.0000 mg | DELAYED_RELEASE_TABLET | Freq: Two times a day (BID) | ORAL | 2 refills | Status: DC

## 2016-03-30 MED ORDER — ESCITALOPRAM OXALATE 10 MG PO TABS: 10.0000 mg | ORAL_TABLET | Freq: Every day | ORAL | 1 refills | Status: DC

## 2016-03-30 MED ORDER — NICOTINE 21 MG/24HR TD PT24: 21.0000 mg | MEDICATED_PATCH | Freq: Every day | TRANSDERMAL | 1 refills | Status: DC

## 2016-03-30 MED ORDER — AMLODIPINE BESYLATE 2.5 MG PO TABS: 2.5000 mg | ORAL_TABLET | Freq: Every day | ORAL | 2 refills | Status: DC

## 2016-03-30 MED ORDER — RANITIDINE HCL 150 MG PO TABS: 150.0000 mg | ORAL_TABLET | Freq: Two times a day (BID) | ORAL | 2 refills | Status: DC

## 2016-03-30 MED ORDER — HYDROCHLOROTHIAZIDE 25 MG PO TABS: 25.0000 mg | ORAL_TABLET | Freq: Every day | ORAL | 2 refills | Status: DC

## 2016-03-30 MED ORDER — TRAZODONE HCL 50 MG PO TABS
25.0000 mg | ORAL_TABLET | Freq: Every evening | ORAL | 1 refills | Status: DC | PRN
Start: 2016-03-30 — End: 2016-06-28

## 2016-03-30 MED FILL — ?ESCITALOPRAM 10 MG TABLET: 10 | 37 days supply | Qty: 60 | Fill #0

## 2016-03-30 MED FILL — traZODone HCL 50 MG TABS: 50 | 30 days supply | Qty: 30 | Fill #0

## 2016-03-30 MED FILL — raNITIdine HCL 150 MG TABS: 150 | 30 days supply | Qty: 60 | Fill #0

## 2016-03-30 MED FILL — ?DICLOFENAC SOD DR 75 MG TA: 75 | 15 days supply | Qty: 30 | Fill #0

## 2016-03-30 MED FILL — AMLODIPINE BESYLATE 2.5 MG: 2.5 | 30 days supply | Qty: 30 | Fill #0

## 2016-03-30 MED FILL — HYDROCHLOROTHIAZIDE 25 MG T: 25 | 30 days supply | Qty: 30 | Fill #0

## 2016-03-30 NOTE — Patient Instructions (Signed)
Come back for BP check in 2 weeks.   Hypertension Hypertension is another name for high blood pressure. High blood pressure forces your heart to work harder to pump blood. A blood pressure reading has two numbers, which includes a higher number over a lower number (example: 110/72). Follow these instructions at home:  Have your blood pressure rechecked by your doctor.  Only take medicine as told by your doctor. Follow the directions carefully. The medicine does not work as well if you skip doses. Skipping doses also puts you at risk for problems.  Do not smoke.  Monitor your blood pressure at home as told by your doctor. Contact a doctor if:  You think you are having a reaction to the medicine you are taking.  You have repeat headaches or feel dizzy.  You have puffiness (swelling) in your ankles.  You have trouble with your vision. Get help right away if:  You get a very bad headache and are confused.  You feel weak, numb, or faint.  You get chest or belly (abdominal) pain.  You throw up (vomit).  You cannot breathe very well. This information is not intended to replace advice given to you by your health care provider. Make sure you discuss any questions you have with your health care provider. Document Released: 07/19/2007 Document Revised: 07/08/2015 Document Reviewed: 11/22/2012 Elsevier Interactive Patient Education  2017 [employer]

## 2016-03-30 NOTE — Progress Notes (Signed)
Patient is here for establish care  Patient complains about a long term ankle pain  Patient has eaten today  Patient only took aspirin today

## 2016-03-30 NOTE — Progress Notes (Signed)
Subjective:  Patient ID: Thomas Spence, male    DOB: [birth year]  Age: 56 y.o. MRN: [id number]  CC: Establish Care   HPI Thomas Spence presents for complaints of bilateral joint pains in the ankles. He reports chronic pain for one year,  9 out of 10 pain, and difficulty weight bearing. Reports history of ankle injuries over 20 years ago due to playing basketball. He also complains of history of anxiety since childhood. Reports going to [facility] in the past for counseling services. Difficulty sleeping, averaging 4 hours of sleep per night. He denies any SI/HI.     Outpatient Medications Prior to Visit  Medication Sig Dispense Refill  . ALPRAZolam (XANAX) 1 MG tablet take 1 tablet by mouth at bedtime 30 tablet 1  . amoxicillin (AMOXIL) 875 MG tablet Take 1 tablet (875 mg total) by mouth 2 (two) times daily. 14 tablet 0  . diclofenac (VOLTAREN) 75 MG EC tablet Take 1 tablet (75 mg total) by mouth 2 (two) times daily as needed. 60 tablet 2  . gabapentin (NEURONTIN) 100 MG capsule Take 1 capsule (100 mg total) by mouth 3 (three) times daily. 90 capsule 3  . oxyCODONE-acetaminophen (PERCOCET/ROXICET) 5-325 MG per tablet Take 1 tablet by mouth daily as needed. 30 tablet 0  . pantoprazole (PROTONIX) 40 MG tablet Take 1 tablet (40 mg total) by mouth daily. Switch for any other PPI at similar dose and frequency 30 tablet 3  . tiZANidine (ZANAFLEX) 4 MG tablet Take 1 tablet (4 mg total) by mouth every 8 (eight) hours as needed. 60 tablet 0  . traMADol (ULTRAM) 50 MG tablet Take 1 tablet (50 mg total) by mouth every 8 (eight) hours as needed for moderate pain. 60 tablet 0  . hydrochlorothiazide (HYDRODIURIL) 12.5 MG tablet Take 1 tablet (12.5 mg total) by mouth daily. May take 25 mg a day if needed 60 tablet 3   No facility-administered medications prior to visit.     ROS Review of Systems  Respiratory: Negative.   Cardiovascular: Negative.   Gastrointestinal: Negative.     Psychiatric/Behavioral: Positive for dysphoric mood and sleep disturbance. The patient is nervous/anxious.        Objective:  BP (!) 142/94 (BP Location: Right Arm, Patient Position: Sitting, Cuff Size: Normal)   Pulse 78   Temp 99 F (37.2 C) (Oral)   Resp 18   Ht 6\' 3"  (1.905 m)   Wt 235 lb 9.6 oz (106.9 kg)   SpO2 96%   BMI 29.45 kg/m   BP/Weight 03/30/2016 11/11/2013 03/24/2013  Systolic BP 142 130 133  Diastolic BP 94 80 84  Wt. (Lbs) 235.6 216 226  BMI 29.45 26.3 27.52     Physical Exam  Eyes: Conjunctivae are normal. Pupils are equal, round, and reactive to light.  Neck: No JVD present.  Cardiovascular: Normal rate, regular rhythm, normal heart sounds and intact distal pulses.   Pulmonary/Chest: Effort normal and breath sounds normal.  Abdominal: Soft. Bowel sounds are normal.  Skin: Skin is warm and dry.  Psychiatric: His mood appears anxious. His speech is rapid and/or pressured. He is hyperactive. He expresses no homicidal and no suicidal ideation. He expresses no suicidal plans and no homicidal plans.  Nursing note and vitals reviewed.    Assessment & Plan:   Problem List Items Addressed This Visit      Cardiovascular and Mediastinum   Hypertension - Primary   Relevant Medications   hydrochlorothiazide (HYDRODIURIL) 25 MG tablet  amLODipine (NORVASC) 2.5 MG tablet   Other Relevant Orders   BASIC METABOLIC PANEL WITH GFR (Completed)   Microalbumin/Creatinine Ratio, Urine (Completed)     Digestive   GERD (gastroesophageal reflux disease)   Relevant Medications   ranitidine (ZANTAC) 150 MG tablet    Other Visit Diagnoses    Chronic pain of both ankles       Relevant Medications   diclofenac (VOLTAREN) 75 MG EC tablet   Other Relevant Orders   Ambulatory referral to Orthopedics   Ambulatory referral to Physical Therapy   Anxiety disorder, unspecified type       Relevant Medications   escitalopram (LEXAPRO) 10 MG tablet   Depression, unspecified  depression type       Relevant Medications   traZODone (DESYREL) 50 MG tablet   escitalopram (LEXAPRO) 10 MG tablet   Difficulty in weight bearing       Relevant Medications   diclofenac (VOLTAREN) 75 MG EC tablet   Other Relevant Orders   Ambulatory referral to Orthopedics   Ambulatory referral to Physical Therapy   Primary insomnia       Relevant Medications   traZODone (DESYREL) 50 MG tablet   Needs flu shot       Relevant Orders   Flu Vaccine QUAD 36+ mos PF IM (Fluarix & Fluzone Quad PF) (Completed)   Quit smoking       Relevant Medications   nicotine (NICODERM CQ) 21 mg/24hr patch   Healthcare maintenance       Relevant Orders   HIV antibody (with reflex) (Completed)   Hepatitis C Antibody (Completed)      Meds ordered this encounter  Medications  . hydrochlorothiazide (HYDRODIURIL) 25 MG tablet    Sig: Take 1 tablet (25 mg total) by mouth daily.    Dispense:  30 tablet    Refill:  2    Order Specific Question:   Supervising Provider    Answer:   Quentin Angst [id number]  . diclofenac (VOLTAREN) 75 MG EC tablet    Sig: Take 1 tablet (75 mg total) by mouth 2 (two) times daily.    Dispense:  30 tablet    Refill:  2    Order Specific Question:   Supervising Provider    Answer:   Quentin Angst [id number]  . amLODipine (NORVASC) 2.5 MG tablet    Sig: Take 1 tablet (2.5 mg total) by mouth daily.    Dispense:  30 tablet    Refill:  2    Order Specific Question:   Supervising Provider    Answer:   Quentin Angst [id number]  . ranitidine (ZANTAC) 150 MG tablet    Sig: Take 1 tablet (150 mg total) by mouth 2 (two) times daily.    Dispense:  60 tablet    Refill:  2    Order Specific Question:   Supervising Provider    Answer:   Quentin Angst [id number]  . traZODone (DESYREL) 50 MG tablet    Sig: Take 0.5-1 tablets (25-50 mg total) by mouth at bedtime as needed for sleep.    Dispense:  30 tablet    Refill:  1    Order Specific Question:    Supervising Provider    Answer:   Quentin Angst [id number]  . escitalopram (LEXAPRO) 10 MG tablet    Sig: Take 1 tablet (10 mg total) by mouth daily. After one week, may increase to 2 tablets (20 mg total) by mouth daily.  Dispense:  60 tablet    Refill:  1    Order Specific Question:   Supervising Provider    Answer:   Quentin AngstJEGEDE, OLUGBEMIGA E [id number]  . nicotine (NICODERM CQ) 21 mg/24hr patch    Sig: Place 1 patch (21 mg total) onto the skin daily.    Dispense:  28 patch    Refill:  1    Order Specific Question:   Supervising Provider    Answer:   Quentin AngstJEGEDE, OLUGBEMIGA E [id number]    Follow-up: Return in about 2 weeks (around 04/13/2016) for BP check with nurse.   Lizbeth BarkMandesia R Doss Cybulski FNP

## 2016-03-31 LAB — HEPATITIS C ANTIBODY: HCV AB: NEGATIVE

## 2016-03-31 LAB — MICROALBUMIN / CREATININE URINE RATIO
Creatinine, Urine: 118 mg/dL (ref 20–370)
MICROALB UR: 0.3 mg/dL
Microalb Creat Ratio: 3 mcg/mg creat (ref <30)

## 2016-03-31 NOTE — BH Specialist Note (Signed)
Session Start time: 3:00 PM   End Time: 3:30 PM Total Time:  30 minutes Type of Service: Behavioral Health - Individual/Family Interpreter: No.   Interpreter Name & Language: N/A # [facility]   SUBJECTIVE: Thomas Spence is a 56 y.o. male  Pt. 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 Pt. reports the following symptoms/concerns: overwhelming feelings of sadness and worry, difficulty sleeping, low energy/motivation, and decreased appetite Duration of problem:  Ongoing Pt reports being diagnosed with PTSD, Depression, and Anxiety 10-12 years ago Severity: moderate Previous treatment: [facility]   OBJECTIVE: Mood: Anxious & Affect: Appropriate Risk of harm to self or others: Pt denied SI/HI/AVH Assessments administered: PHQ-9; GAD-7  LIFE CONTEXT:  Family & Social: Pt resides with sister and has three adult children  School/ Work: Pt receives [employer] ($1000)  Self-Care: Pt smokes cigarettes (.5 ppd) Life changes: Pt is experiencing financial strain due to a $2500 bill from [employer]. Pt is unable to obtain independent housing due to inability to pay off utility bill. What is important to pt/family (values): Family, Independence   GOALS ADDRESSED:  Decrease symptoms of depression Decrease symptoms of anxiety Increase knowledge of coping skills Increase adequate support for patient  INTERVENTIONS: Solution Focused, Strength-based and Supportive   ASSESSMENT:  Pt currently experiencing depression and anxiety triggered by financial strain. Pt reports overwhelming feelings of sadness and worry, difficulty sleeping, low energy/motivation, and decreased appetite. He receives support from his sister and adult children. Pt [facility]. LCSWA educated pt on the cycle of depression and the importance of implementing healthy  coping strategies to decrease symptoms. Pt was successful in identifying healthy strategies to utilize on a daily basis. Pt is aware of community agencies that provide therapy, medication management, crisis interventions, and obtaining stable housing. LCSWA provided pt with resources for utility assistance to assist with financial strain.     PLAN: 1. F/U with behavioral health clinician: Pt was encouraged to contact LCSWA if symptoms worsen or fail to improve to [facility]. 2. Behavioral Health meds: None reported 3. Behavioral recommendations: LCSWA recommends that pt apply healthy coping skills discussed, continue to participate in services through [facility], and utilize community resources. Pt is encouraged to schedule follow up appointment with LCSWA 4. Referral: Brief Counseling/Psychotherapy, [facility], Problem-solving teaching/coping strategies and Supportive Counseling 5. From scale of 1-10, how likely are you to follow plan: 8/10   Bridgett LarssonJasmine D Spence, MSW, [facility]  Clinical Social Worker 03/31/16 2:48 PM  Warmhandoff:   Warm Hand Off Completed.

## 2016-04-11 ENCOUNTER — Telehealth: Payer: Self-pay | Admitting: *Deleted

## 2016-04-11 NOTE — Telephone Encounter (Signed)
-----   Message from Lizbeth BarkMandesia R Hairston, FNP sent at 04/06/2016 12:57 PM EST ----- HIV is negative.  Hepatitis C is negative.  Microalbumin/creatinine ratio level was normal. This tests for protein in your urine that can indicate early signs of kidney damage.  Kidney function normal

## 2016-04-11 NOTE — Telephone Encounter (Signed)
Patient is aware of HIV, HEP C MICRO/ALBUMIN, and kidney function all being normal. Medical Assistant left message on patient's home and cell voicemail. Voicemail states to give a call back to [place] [facility] at [phone].

## 2016-04-19 ENCOUNTER — Ambulatory Visit: Payer: Medicare Other | Admitting: Family Medicine

## 2016-04-20 ENCOUNTER — Ambulatory Visit: Payer: Medicare Other

## 2016-05-01 ENCOUNTER — Ambulatory Visit: Payer: Medicare Other | Attending: Family Medicine

## 2016-05-04 ENCOUNTER — Other Ambulatory Visit: Payer: Self-pay | Admitting: Family Medicine

## 2016-05-04 ENCOUNTER — Ambulatory Visit: Payer: Medicare Other | Attending: Family Medicine | Admitting: *Deleted

## 2016-05-04 VITALS — BP 138/90 | HR 72 | Resp 20

## 2016-05-04 DIAGNOSIS — I10 Essential (primary) hypertension: Secondary | ICD-10-CM | POA: Insufficient documentation

## 2016-05-04 NOTE — Progress Notes (Unsigned)
Patient followed up for BP check with clinic RN. Was reported to PCP that patient did not take his BP medication today. Recommend follow up with clinic RN again when patient has taken his BP medications to evaluate effectiveness.

## 2016-05-04 NOTE — Progress Notes (Signed)
Pt here for f/u BP check. Pt denies chest pain, SOB, HA, new vison concerns, or generalized swelling.  He c/o pain in right ankle. He also request for provider to increase trazadone, "taking forever to go to sleep."  Has been taking medications as prescribed. Blood pressure taken manually while patient is sitting. 140/92 and 138/90. Pt stated he had not taken medication this morning. Will notify patient's provider of BP.   Pt instructed to return to office for BP check per provider. He was given instruction to take medication for the appointment. Pt verbalized understanding. Nurse visit will be routed to provider.Guy Francoravia Marlyce Mcdougald, RN, BSN

## 2016-06-06 ENCOUNTER — Ambulatory Visit (INDEPENDENT_AMBULATORY_CARE_PROVIDER_SITE_OTHER): Payer: Medicare Other | Admitting: Physician Assistant

## 2016-06-14 ENCOUNTER — Ambulatory Visit (INDEPENDENT_AMBULATORY_CARE_PROVIDER_SITE_OTHER): Payer: Medicare Other | Admitting: Physician Assistant

## 2016-06-28 ENCOUNTER — Encounter (INDEPENDENT_AMBULATORY_CARE_PROVIDER_SITE_OTHER): Payer: Self-pay | Admitting: Physician Assistant

## 2016-06-28 ENCOUNTER — Ambulatory Visit (INDEPENDENT_AMBULATORY_CARE_PROVIDER_SITE_OTHER): Payer: Medicare Other | Admitting: Physician Assistant

## 2016-06-28 VITALS — BP 132/95 | HR 89 | Temp 98.1°F | Ht 75.0 in | Wt 242.6 lb

## 2016-06-28 DIAGNOSIS — F411 Generalized anxiety disorder: Secondary | ICD-10-CM | POA: Diagnosis not present

## 2016-06-28 DIAGNOSIS — I10 Essential (primary) hypertension: Secondary | ICD-10-CM

## 2016-06-28 DIAGNOSIS — F5101 Primary insomnia: Secondary | ICD-10-CM | POA: Diagnosis not present

## 2016-06-28 DIAGNOSIS — M25571 Pain in right ankle and joints of right foot: Secondary | ICD-10-CM

## 2016-06-28 DIAGNOSIS — Z760 Encounter for issue of repeat prescription: Secondary | ICD-10-CM

## 2016-06-28 DIAGNOSIS — G8929 Other chronic pain: Secondary | ICD-10-CM | POA: Diagnosis not present

## 2016-06-28 MED ORDER — ESCITALOPRAM OXALATE 20 MG PO TABS: 20.0000 mg | ORAL_TABLET | Freq: Every day | ORAL | 1 refills | Status: DC

## 2016-06-28 MED ORDER — TRAZODONE HCL 50 MG PO TABS: 50.0000 mg | ORAL_TABLET | Freq: Every evening | ORAL | 1 refills | Status: DC | PRN

## 2016-06-28 MED ORDER — MELOXICAM 7.5 MG PO TABS: 7.5000 mg | ORAL_TABLET | Freq: Every day | ORAL | 0 refills | Status: DC

## 2016-06-28 MED ORDER — GABAPENTIN 300 MG PO CAPS: 300.0000 mg | ORAL_CAPSULE | Freq: Three times a day (TID) | ORAL | 3 refills | Status: DC

## 2016-06-28 MED ORDER — HYDROCHLOROTHIAZIDE 12.5 MG PO TABS: 12.5000 mg | ORAL_TABLET | Freq: Every day | ORAL | 1 refills | Status: DC

## 2016-06-28 MED ORDER — AMLODIPINE BESYLATE 2.5 MG PO TABS: 2.5000 mg | ORAL_TABLET | Freq: Every day | ORAL | 1 refills | Status: DC

## 2016-06-28 NOTE — Progress Notes (Signed)
Subjective:  Patient ID: Thomas Spence, male    DOB: Jun 20, 1960  Age: 56 y.o. MRN: [id number]  CC: ankle pain  HPI Thomas Spence is a 56 y.o. male with a PMH of depression, GERD, osteoporosis, and Neuromuscular disorder presents with right ankle pain. He has marked an X with a marker in the areas of pain. Says he had arthoplasty in the right ankle. Went to Sports Medicine 1-2 years ago. Had xrays done of right ankle which were negative. Pain in the area of the ATFL and peroneal tendon. Pain is severe 10/10 with weight bearing. Currently 2/10 sitting. Pain is decribed as sharp and throbbing. Has to massage and take NSAID for mild relief. Says he is ready to have surgery to have pain relieved.        Outpatient Medications Prior to Visit  Medication Sig Dispense Refill  . amLODipine (NORVASC) 2.5 MG tablet Take 1 tablet (2.5 mg total) by mouth daily. 30 tablet 2  . escitalopram (LEXAPRO) 10 MG tablet Take 1 tablet (10 mg total) by mouth daily. After one week, may increase to 2 tablets (20 mg total) by mouth daily. 60 tablet 1  . traZODone (DESYREL) 50 MG tablet Take 0.5-1 tablets (25-50 mg total) by mouth at bedtime as needed for sleep. 30 tablet 1  . ALPRAZolam (XANAX) 1 MG tablet take 1 tablet by mouth at bedtime (Patient not taking: Reported on 05/04/2016) 30 tablet 1  . amoxicillin (AMOXIL) 875 MG tablet Take 1 tablet (875 mg total) by mouth 2 (two) times daily. (Patient not taking: Reported on 05/04/2016) 14 tablet 0  . diclofenac (VOLTAREN) 75 MG EC tablet Take 1 tablet (75 mg total) by mouth 2 (two) times daily as needed. (Patient not taking: Reported on 05/04/2016) 60 tablet 2  . diclofenac (VOLTAREN) 75 MG EC tablet Take 1 tablet (75 mg total) by mouth 2 (two) times daily. (Patient not taking: Reported on 05/04/2016) 30 tablet 2  . gabapentin (NEURONTIN) 100 MG capsule Take 1 capsule (100 mg total) by mouth 3 (three) times daily. (Patient not taking: Reported on 05/04/2016) 90  capsule 3  . hydrochlorothiazide (HYDRODIURIL) 25 MG tablet Take 1 tablet (25 mg total) by mouth daily. (Patient not taking: Reported on 06/28/2016) 30 tablet 2  . nicotine (NICODERM CQ) 21 mg/24hr patch Place 1 patch (21 mg total) onto the skin daily. (Patient not taking: Reported on 05/04/2016) 28 patch 1  . oxyCODONE-acetaminophen (PERCOCET/ROXICET) 5-325 MG per tablet Take 1 tablet by mouth daily as needed. (Patient not taking: Reported on 05/04/2016) 30 tablet 0  . pantoprazole (PROTONIX) 40 MG tablet Take 1 tablet (40 mg total) by mouth daily. Switch for any other PPI at similar dose and frequency (Patient not taking: Reported on 05/04/2016) 30 tablet 3  . ranitidine (ZANTAC) 150 MG tablet Take 1 tablet (150 mg total) by mouth 2 (two) times daily. (Patient not taking: Reported on 06/28/2016) 60 tablet 2  . tiZANidine (ZANAFLEX) 4 MG tablet Take 1 tablet (4 mg total) by mouth every 8 (eight) hours as needed. (Patient not taking: Reported on 05/04/2016) 60 tablet 0  . traMADol (ULTRAM) 50 MG tablet Take 1 tablet (50 mg total) by mouth every 8 (eight) hours as needed for moderate pain. (Patient not taking: Reported on 05/04/2016) 60 tablet 0   No facility-administered medications prior to visit.      ROS Review of Systems  Constitutional: Negative for chills, fever and malaise/fatigue.  Eyes: Negative for blurred vision.  Respiratory: Negative for shortness of breath.   Cardiovascular: Negative for chest pain and palpitations.  Gastrointestinal: Negative for abdominal pain and nausea.  Genitourinary: Negative for dysuria and hematuria.  Musculoskeletal: Positive for joint pain. Negative for myalgias.  Skin: Negative for rash.  Neurological: Negative for tingling and headaches.  Psychiatric/Behavioral: Negative for depression. The patient is nervous/anxious and has insomnia.     Objective:  BP (!) 132/95 (BP Location: Left Arm, Patient Position: Sitting, Cuff Size: Large)   Pulse 89   Temp  98.1 F (36.7 C) (Oral)   Ht 6\' 3"  (1.905 m)   Wt 242 lb 9.6 oz (110 kg)   SpO2 95%   BMI 30.32 kg/m   BP/Weight 06/28/2016 05/04/2016 03/30/2016  Systolic BP 132 138 142  Diastolic BP 95 90 94  Wt. (Lbs) 242.6 - 235.6  BMI 30.32 - 29.45      Physical Exam  Constitutional: He is oriented to person, place, and time.  Well developed, obese, NAD, polite  HENT:  Head: Normocephalic and atraumatic.  Eyes: No scleral icterus.  Cardiovascular: Normal rate, regular rhythm and normal heart sounds.   Pulmonary/Chest: Effort normal and breath sounds normal.  Musculoskeletal: He exhibits no edema.  Mild TTP along the peroneal tendon on right foot. Very mild TTP along the distribution of the ATFL of right foot.  Neurological: He is alert and oriented to person, place, and time. No cranial nerve deficit. Coordination normal.  RLE strength 5/5  Skin: Skin is warm and dry. No rash noted. No erythema. No pallor.  Psychiatric: He has a normal mood and affect. His behavior is normal. Thought content normal.  Vitals reviewed.    Assessment & Plan:   1. Chronic pain of right ankle - Begin gabapentin (NEURONTIN) 300 MG capsule; Take 1 capsule (300 mg total) by mouth 3 (three) times daily.  Dispense: 90 capsule; Refill: 3. Patient instructed on how to uptitrate. - Begin meloxicam (MOBIC) 7.5 MG tablet; Take 1 tablet (7.5 mg total) by mouth daily.  Dispense: 30 tablet; Refill: 0  2. Essential hypertension - Begin hydrochlorothiazide (HYDRODIURIL) 12.5 MG tablet; Take 1 tablet (12.5 mg total) by mouth daily.  Dispense: 90 tablet; Refill: 1 - Refill amLODipine (NORVASC) 2.5 MG tablet; Take 1 tablet (2.5 mg total) by mouth daily.  Dispense: 90 tablet; Refill: 1  3. Anxiety state - Increase escitalopram (LEXAPRO) 20 MG tablet; Take 1 tablet (20 mg total) by mouth daily.  Dispense: 90 tablet; Refill: 1  4. Medication refill - Refill traZODone (DESYREL) 50 MG tablet; Take 1 tablet (50 mg total) by  mouth at bedtime as needed for sleep.  Dispense: 30 tablet; Refill: 1     Meds ordered this encounter  Medications  . hydrochlorothiazide (HYDRODIURIL) 12.5 MG tablet    Sig: Take 1 tablet (12.5 mg total) by mouth daily.    Dispense:  90 tablet    Refill:  1    Order Specific Question:   Supervising Provider    Answer:   Quentin AngstJEGEDE, OLUGBEMIGA E [id number]  . amLODipine (NORVASC) 2.5 MG tablet    Sig: Take 1 tablet (2.5 mg total) by mouth daily.    Dispense:  90 tablet    Refill:  1    Order Specific Question:   Supervising Provider    Answer:   Quentin AngstJEGEDE, OLUGBEMIGA E [id number]  . escitalopram (LEXAPRO) 20 MG tablet    Sig: Take 1 tablet (20 mg total) by mouth daily.    Dispense:  90 tablet    Refill:  1    Order Specific Question:   Supervising Provider    Answer:   Quentin Angst [id number]  . gabapentin (NEURONTIN) 300 MG capsule    Sig: Take 1 capsule (300 mg total) by mouth 3 (three) times daily.    Dispense:  90 capsule    Refill:  3    Order Specific Question:   Supervising Provider    Answer:   Quentin Angst [id number]  . meloxicam (MOBIC) 7.5 MG tablet    Sig: Take 1 tablet (7.5 mg total) by mouth daily.    Dispense:  30 tablet    Refill:  0    Order Specific Question:   Supervising Provider    Answer:   Quentin Angst [id number]  . traZODone (DESYREL) 50 MG tablet    Sig: Take 1 tablet (50 mg total) by mouth at bedtime as needed for sleep.    Dispense:  30 tablet    Refill:  1    Order Specific Question:   Supervising Provider    Answer:   Quentin Angst [[id number]]    Follow-up: Return in about 4 weeks (around 07/26/2016) for HTN, full physical.   Loletta Specter PA

## 2016-06-28 NOTE — Patient Instructions (Signed)

## 2016-07-21 ENCOUNTER — Ambulatory Visit (INDEPENDENT_AMBULATORY_CARE_PROVIDER_SITE_OTHER): Payer: Medicare Other | Admitting: Physician Assistant

## 2016-07-21 ENCOUNTER — Encounter (INDEPENDENT_AMBULATORY_CARE_PROVIDER_SITE_OTHER): Payer: Self-pay | Admitting: Physician Assistant

## 2016-07-21 ENCOUNTER — Other Ambulatory Visit (INDEPENDENT_AMBULATORY_CARE_PROVIDER_SITE_OTHER): Payer: Self-pay | Admitting: Physician Assistant

## 2016-07-21 VITALS — BP 142/101 | HR 80 | Temp 98.2°F | Wt 245.6 lb

## 2016-07-21 DIAGNOSIS — G8929 Other chronic pain: Secondary | ICD-10-CM

## 2016-07-21 DIAGNOSIS — M25571 Pain in right ankle and joints of right foot: Principal | ICD-10-CM

## 2016-07-21 DIAGNOSIS — G4700 Insomnia, unspecified: Secondary | ICD-10-CM

## 2016-07-21 DIAGNOSIS — F411 Generalized anxiety disorder: Secondary | ICD-10-CM | POA: Diagnosis not present

## 2016-07-21 DIAGNOSIS — I10 Essential (primary) hypertension: Secondary | ICD-10-CM | POA: Diagnosis not present

## 2016-07-21 MED ORDER — ESCITALOPRAM OXALATE 20 MG PO TABS: 20.0000 mg | ORAL_TABLET | Freq: Every day | ORAL | 1 refills | Status: DC

## 2016-07-21 MED ORDER — MELOXICAM 7.5 MG PO TABS: 7.5000 mg | ORAL_TABLET | Freq: Every day | ORAL | 0 refills | Status: DC

## 2016-07-21 MED ORDER — GABAPENTIN 300 MG PO CAPS: 300.0000 mg | ORAL_CAPSULE | Freq: Three times a day (TID) | ORAL | 3 refills | Status: DC

## 2016-07-21 MED ORDER — MELATONIN 5 MG PO TABS: 1.0000 | ORAL_TABLET | Freq: Every day | ORAL | 2 refills | Status: DC

## 2016-07-21 MED ORDER — AMLODIPINE BESYLATE 2.5 MG PO TABS: 2.5000 mg | ORAL_TABLET | Freq: Every day | ORAL | 1 refills | Status: DC

## 2016-07-21 MED ORDER — HYDROCHLOROTHIAZIDE 25 MG PO TABS: 25.0000 mg | ORAL_TABLET | Freq: Every day | ORAL | 1 refills | Status: DC

## 2016-07-21 NOTE — Patient Instructions (Signed)

## 2016-07-21 NOTE — Progress Notes (Signed)
Subjective:  Patient ID: Thomas Spence Winton, male    DOB: [birth year]  Age: 56 y.o. MRN: [id number]  CC: f/u knee pain  HPI Thomas Spence Prestigiacomo is a 56 y.o. male with a PMH of depression, GERD, and chronic ankle pain presents on f/u of right ankle pain. Pt reports he has not been able to fill any of his medications prescribed at the previous visit due to lack of funds. Thinks he may be able to afford medication this time and would like a prescription. Pain in the right ankle is worse along the peroneal nerve distribution in proximity to the lateral malleolus. Pain is characterized as "pulling, sharp, and burning".      In addition, patient is concerned about his insomnia. Says he is becoming "immune" to his trazadone. Thinks he has sleep apnea. Wakes up with a feeling of "choking". Has not had sleep studies performed. Does not endorse any other symptoms.    Outpatient Medications Prior to Visit  Medication Sig Dispense Refill  . traZODone (DESYREL) 50 MG tablet Take 1 tablet (50 mg total) by mouth at bedtime as needed for sleep. 30 tablet 1  . amLODipine (NORVASC) 2.5 MG tablet Take 1 tablet (2.5 mg total) by mouth daily. 90 tablet 1  . escitalopram (LEXAPRO) 20 MG tablet Take 1 tablet (20 mg total) by mouth daily. 90 tablet 1  . gabapentin (NEURONTIN) 300 MG capsule Take 1 capsule (300 mg total) by mouth 3 (three) times daily. 90 capsule 3  . hydrochlorothiazide (HYDRODIURIL) 12.5 MG tablet Take 1 tablet (12.5 mg total) by mouth daily. 90 tablet 1  . meloxicam (MOBIC) 7.5 MG tablet Take 1 tablet (7.5 mg total) by mouth daily. 30 tablet 0   No facility-administered medications prior to visit.      ROS Review of Systems  Constitutional: Negative for chills, fever and malaise/fatigue.  Eyes: Negative for blurred vision.  Respiratory: Negative for shortness of breath.   Cardiovascular: Negative for chest pain and palpitations.  Gastrointestinal: Negative for abdominal pain and nausea.   Genitourinary: Negative for dysuria and hematuria.  Musculoskeletal: Positive for joint pain. Negative for myalgias.  Skin: Negative for rash.  Neurological: Negative for tingling and headaches.       Right ankle paresthesia  Psychiatric/Behavioral: Negative for depression. The patient is nervous/anxious and has insomnia.     Objective:  BP (!) 142/101 (BP Location: Left Arm, Patient Position: Sitting, Cuff Size: Large)   Pulse 80   Temp 98.2 F (36.8 C) (Oral)   Wt 245 lb 9.6 oz (111.4 kg)   SpO2 94%   BMI 30.70 kg/m   BP/Weight 07/21/2016 06/28/2016 05/04/2016  Systolic BP 142 132 138  Diastolic BP 101 95 90  Wt. (Lbs) 245.6 242.6 -  BMI 30.7 30.32 -      Physical Exam  Constitutional: He is oriented to person, place, and time.  Well developed, overweight, NAD, polite  HENT:  Head: Normocephalic and atraumatic.  Eyes: No scleral icterus.  Cardiovascular: Normal rate, regular rhythm and normal heart sounds.   Pulmonary/Chest: Effort normal and breath sounds normal.  Musculoskeletal: He exhibits no edema.  RLE with full aROM. Mild TTP along the peroneal tendon and also along the ATFL.  Neurological: He is alert and oriented to person, place, and time. No cranial nerve deficit. Coordination normal.  Skin: Skin is warm and dry. No rash noted. No erythema. No pallor.  Psychiatric: He has a normal mood and affect. His behavior is normal. Thought  content normal.  Vitals reviewed.    Assessment & Plan:   1. Chronic pain of right ankle - Refill gabapentin (NEURONTIN) 300 MG capsule; Take 1 capsule (300 mg total) by mouth 3 (three) times daily.  Dispense: 90 capsule; Refill: 3 - DG Foot Complete Right; Future  2. Essential hypertension - Refill hydrochlorothiazide (HYDRODIURIL) 25 MG tablet; Take 1 tablet (25 mg total) by mouth daily. Take on tablet in the morning.  Dispense: 90 tablet; Refill: 1 - Refill amLODipine (NORVASC) 2.5 MG tablet; Take 1 tablet (2.5 mg total) by  mouth daily.  Dispense: 90 tablet; Refill: 1  3. Anxiety state - Refill escitalopram (LEXAPRO) 20 MG tablet; Take 1 tablet (20 mg total) by mouth daily.  Dispense: 90 tablet; Refill: 1  4. Insomnia, unspecified type - Nocturnal polysomnography; Future - Begin Melatonin 5 MG TABS; Take 1 tablet (5 mg total) by mouth daily.  Dispense: 30 tablet; Refill: 2   Meds ordered this encounter  Medications  . DISCONTD: meloxicam (MOBIC) 7.5 MG tablet    Sig: Take 1 tablet (7.5 mg total) by mouth daily.    Dispense:  30 tablet    Refill:  0    Order Specific Question:   Supervising Provider    Answer:   Quentin Angst [id number]  . gabapentin (NEURONTIN) 300 MG capsule    Sig: Take 1 capsule (300 mg total) by mouth 3 (three) times daily.    Dispense:  90 capsule    Refill:  3    Order Specific Question:   Supervising Provider    Answer:   Quentin Angst [id number]  . hydrochlorothiazide (HYDRODIURIL) 25 MG tablet    Sig: Take 1 tablet (25 mg total) by mouth daily. Take on tablet in the morning.    Dispense:  90 tablet    Refill:  1    Order Specific Question:   Supervising Provider    Answer:   Quentin Angst [id number]  . escitalopram (LEXAPRO) 20 MG tablet    Sig: Take 1 tablet (20 mg total) by mouth daily.    Dispense:  90 tablet    Refill:  1    Order Specific Question:   Supervising Provider    Answer:   Quentin Angst [id number]  . amLODipine (NORVASC) 2.5 MG tablet    Sig: Take 1 tablet (2.5 mg total) by mouth daily.    Dispense:  90 tablet    Refill:  1    Order Specific Question:   Supervising Provider    Answer:   Quentin Angst [id number]  . Melatonin 5 MG TABS    Sig: Take 1 tablet (5 mg total) by mouth daily.    Dispense:  30 tablet    Refill:  2    Order Specific Question:   Supervising Provider    Answer:   Quentin Angst [id number]    Follow-up: Return in about 4 weeks (around 08/18/2016) for insomnia.   Loletta Specter  PA

## 2016-07-21 NOTE — Telephone Encounter (Signed)
FWD to PCP. Thomas Spence S Thomas Spence, CMA  

## 2016-08-13 ENCOUNTER — Other Ambulatory Visit (INDEPENDENT_AMBULATORY_CARE_PROVIDER_SITE_OTHER): Payer: Self-pay | Admitting: Physician Assistant

## 2016-08-13 DIAGNOSIS — G8929 Other chronic pain: Secondary | ICD-10-CM

## 2016-08-13 DIAGNOSIS — M25571 Pain in right ankle and joints of right foot: Principal | ICD-10-CM

## 2016-08-14 NOTE — Telephone Encounter (Signed)
FWD to PCP. Tempestt S Roberts, CMA  

## 2016-08-19 ENCOUNTER — Other Ambulatory Visit (INDEPENDENT_AMBULATORY_CARE_PROVIDER_SITE_OTHER): Payer: Self-pay | Admitting: Physician Assistant

## 2016-08-19 DIAGNOSIS — Z760 Encounter for issue of repeat prescription: Secondary | ICD-10-CM

## 2016-08-21 ENCOUNTER — Ambulatory Visit (INDEPENDENT_AMBULATORY_CARE_PROVIDER_SITE_OTHER): Payer: Medicare Other | Admitting: Physician Assistant

## 2016-08-21 NOTE — Telephone Encounter (Signed)
FWD to PCP. Davidson Palmieri S Lynnet Hefley, CMA  

## 2016-08-31 ENCOUNTER — Ambulatory Visit (INDEPENDENT_AMBULATORY_CARE_PROVIDER_SITE_OTHER): Payer: Medicare Other | Admitting: Physician Assistant

## 2016-09-11 ENCOUNTER — Ambulatory Visit (INDEPENDENT_AMBULATORY_CARE_PROVIDER_SITE_OTHER): Payer: Medicare Other | Admitting: Physician Assistant

## 2016-09-11 ENCOUNTER — Other Ambulatory Visit (INDEPENDENT_AMBULATORY_CARE_PROVIDER_SITE_OTHER): Payer: Self-pay | Admitting: Physician Assistant

## 2016-09-11 DIAGNOSIS — Z760 Encounter for issue of repeat prescription: Secondary | ICD-10-CM

## 2016-09-11 DIAGNOSIS — G8929 Other chronic pain: Secondary | ICD-10-CM

## 2016-09-11 DIAGNOSIS — M25571 Pain in right ankle and joints of right foot: Secondary | ICD-10-CM

## 2016-09-11 NOTE — Telephone Encounter (Signed)
FWD to PCP. Tempestt S Roberts, CMA  

## 2016-09-18 ENCOUNTER — Encounter (INDEPENDENT_AMBULATORY_CARE_PROVIDER_SITE_OTHER): Payer: Self-pay | Admitting: Physician Assistant

## 2016-09-18 ENCOUNTER — Ambulatory Visit (INDEPENDENT_AMBULATORY_CARE_PROVIDER_SITE_OTHER): Payer: Medicare Other | Admitting: Physician Assistant

## 2016-09-18 VITALS — BP 144/89 | HR 80 | Temp 98.1°F | Wt 248.0 lb

## 2016-09-18 DIAGNOSIS — G8929 Other chronic pain: Secondary | ICD-10-CM

## 2016-09-18 DIAGNOSIS — I10 Essential (primary) hypertension: Secondary | ICD-10-CM

## 2016-09-18 DIAGNOSIS — G4700 Insomnia, unspecified: Secondary | ICD-10-CM

## 2016-09-18 DIAGNOSIS — F411 Generalized anxiety disorder: Secondary | ICD-10-CM | POA: Diagnosis not present

## 2016-09-18 DIAGNOSIS — M25571 Pain in right ankle and joints of right foot: Secondary | ICD-10-CM | POA: Diagnosis not present

## 2016-09-18 DIAGNOSIS — F17200 Nicotine dependence, unspecified, uncomplicated: Secondary | ICD-10-CM | POA: Diagnosis not present

## 2016-09-18 MED ORDER — VARENICLINE TARTRATE 0.5 MG X 11 & 1 MG X 42 PO MISC: ORAL | 0 refills | Status: DC

## 2016-09-18 MED ORDER — AMLODIPINE BESYLATE 10 MG PO TABS: 10.0000 mg | ORAL_TABLET | Freq: Every day | ORAL | 1 refills | Status: DC

## 2016-09-18 MED ORDER — ESCITALOPRAM OXALATE 20 MG PO TABS: 20.0000 mg | ORAL_TABLET | Freq: Every day | ORAL | 1 refills | Status: DC

## 2016-09-18 MED ORDER — TRAZODONE HCL 100 MG PO TABS: 100.0000 mg | ORAL_TABLET | Freq: Every day | ORAL | 2 refills | Status: DC

## 2016-09-18 MED ORDER — GABAPENTIN 300 MG PO CAPS: 600.0000 mg | ORAL_CAPSULE | Freq: Three times a day (TID) | ORAL | 3 refills | Status: DC

## 2016-09-18 MED ORDER — HYDROCHLOROTHIAZIDE 25 MG PO TABS: 25.0000 mg | ORAL_TABLET | Freq: Every day | ORAL | 1 refills | Status: DC

## 2016-09-18 MED ORDER — MELOXICAM 7.5 MG PO TABS: ORAL_TABLET | ORAL | 0 refills | Status: DC

## 2016-09-18 MED ORDER — MELATONIN 5 MG PO TABS: 1.0000 | ORAL_TABLET | Freq: Every day | ORAL | 2 refills | Status: DC

## 2016-09-18 NOTE — Progress Notes (Signed)
Wants increase on BP medication  Referral for colonoscopy  rx for chantix

## 2016-09-18 NOTE — Progress Notes (Signed)
Subjective:  Patient ID: Thomas Spence, male    DOB: 1960-10-12  Age: 56 y.o. MRN: [id number]  CC: f/u HTN  HPI Thomas Spence is a 56 y.o. male with a PMH of depression, GERD, and chronic ankle pain presents to f/u on HTN and right ankle pain. Says pain on lateral aspect of right ankle persists. Pain located along the distribution of the peroneal nerve along the lateral malleolus. Gabapentin and Meloxicam was not effective in decreasing pain. Patient has not yet gone to have a new right ankle x-ray. XR right ankle in 2008 was normal.     HTN persists despite dual therapy with amlodipine 2.5mg  and HCTZ 25mg . Reports taking anti-hypertensives as directed. Still smoking and has difficulty initiating and maintaining sleep. He was prescribed trazodone and melatonin which helped initiate sleep but still reports difficulty in maintaining sleep. Reports going to sleep with the TV on, does not know much about sleep hygiene. Had a sleep study referral made at last visit but pt did not attend. He is not interested in going to a sleep study at this time. He was diagnosed with anxiety and depression but is not taking his escitalopram. Reportedly, the pharmacy failed to give him his Escitalopram. Lastly, patient requests Chantix since Nicotine patches have failed him and his friends are doing well with Chantix.     Outpatient Medications Prior to Visit  Medication Sig Dispense Refill  . amLODipine (NORVASC) 2.5 MG tablet Take 1 tablet (2.5 mg total) by mouth daily. 90 tablet 1  . escitalopram (LEXAPRO) 20 MG tablet Take 1 tablet (20 mg total) by mouth daily. 90 tablet 1  . gabapentin (NEURONTIN) 300 MG capsule Take 1 capsule (300 mg total) by mouth 3 (three) times daily. 90 capsule 3  . hydrochlorothiazide (HYDRODIURIL) 25 MG tablet Take 1 tablet (25 mg total) by mouth daily. Take on tablet in the morning. 90 tablet 1  . Melatonin 5 MG TABS Take 1 tablet (5 mg total) by mouth daily. 30 tablet 2  .  meloxicam (MOBIC) 7.5 MG tablet TAKE 1 TABLET(7.5 MG) BY MOUTH DAILY 90 tablet 0  . meloxicam (MOBIC) 7.5 MG tablet TAKE 1 TABLET(7.5 MG) BY MOUTH DAILY 30 tablet 0  . traZODone (DESYREL) 50 MG tablet Take 1 tablet (50 mg total) by mouth at bedtime as needed for sleep. 30 tablet 1   No facility-administered medications prior to visit.      ROS Review of Systems  Constitutional: Negative for chills, fever and malaise/fatigue.  Eyes: Negative for blurred vision.  Respiratory: Negative for shortness of breath.   Cardiovascular: Negative for chest pain and palpitations.  Gastrointestinal: Negative for abdominal pain and nausea.  Genitourinary: Negative for dysuria and hematuria.  Musculoskeletal: Positive for joint pain. Negative for myalgias.  Skin: Negative for rash.  Neurological: Negative for tingling and headaches.  Psychiatric/Behavioral: Positive for depression. The patient is nervous/anxious and has insomnia.     Objective:  BP (!) 144/89 (BP Location: Left Arm, Patient Position: Sitting, Cuff Size: Large)   Pulse 80   Temp 98.1 F (36.7 C) (Oral)   Wt 248 lb (112.5 kg)   SpO2 92%   BMI 31.00 kg/m   BP/Weight 09/18/2016 07/21/2016 06/28/2016  Systolic BP 144 142 132  Diastolic BP 89 101 95  Wt. (Lbs) 248 245.6 242.6  BMI 31 30.7 30.32      Physical Exam  Constitutional: He is oriented to person, place, and time.  Well developed, well nourished,  NAD, polite  HENT:  Head: Normocephalic and atraumatic.  Eyes: No scleral icterus.  Cardiovascular: Normal rate, regular rhythm and normal heart sounds.   No carotid bruit bilaterally  Pulmonary/Chest: Effort normal and breath sounds normal.  Musculoskeletal: He exhibits no edema or deformity.  TTP along the peroneal nerve in proximity to the right lateral malleolus. Right ankle with full aROM.  Neurological: He is alert and oriented to person, place, and time. No cranial nerve deficit. Coordination normal.  Right ankle  strength 5/5.Gait normal.  Skin: Skin is warm and dry. No rash noted. No erythema. No pallor.  Psychiatric: He has a normal mood and affect. His behavior is normal. Thought content normal.  Vitals reviewed.    Assessment & Plan:    1. Chronic pain of right ankle - Increase gabapentin (NEURONTIN) 300 MG capsule; Take 2 capsules (600 mg total) by mouth 3 (three) times daily.  Dispense: 180 capsule; Refill: 3  2. Essential hypertension - Refill hydrochlorothiazide (HYDRODIURIL) 25 MG tablet; Take 1 tablet (25 mg total) by mouth daily. Take on tablet in the morning.  Dispense: 90 tablet; Refill: 1 - Increase amLODipine (NORVASC) 10 MG tablet; Take 1 tablet (10 mg total) by mouth daily.  Dispense: 90 tablet; Refill: 1  3. Anxiety state - Begin escitalopram (LEXAPRO) 20 MG tablet; Take 1 tablet (20 mg total) by mouth daily.  Dispense: 90 tablet; Refill: 1 - Will work on insomnia and chronic pain before sending to psychiatry  4. Insomnia, unspecified type - Increase traZODone (DESYREL) 100 MG tablet; Take 1 tablet (100 mg total) by mouth at bedtime.  Dispense: 30 tablet; Refill: 2 - I have counseled patient on proper sleep hygiene   5. Tobacco use disorder - Begin varenicline (CHANTIX STARTING MONTH PAK) 0.5 MG X 11 & 1 MG X 42 tablet; Take one 0.5 mg tablet by mouth once daily for 3 days, then increase to one 0.5 mg tablet twice daily for 4 days, then increase to one 1 mg tablet twice daily.  Dispense: 53 tablet; Refill: 0   Meds ordered this encounter  Medications  . gabapentin (NEURONTIN) 300 MG capsule    Sig: Take 2 capsules (600 mg total) by mouth 3 (three) times daily.    Dispense:  180 capsule    Refill:  3    Order Specific Question:   Supervising Provider    Answer:   Quentin Angst [id number]  . escitalopram (LEXAPRO) 20 MG tablet    Sig: Take 1 tablet (20 mg total) by mouth daily.    Dispense:  90 tablet    Refill:  1    Order Specific Question:   Supervising  Provider    Answer:   Quentin Angst [id number]  . hydrochlorothiazide (HYDRODIURIL) 25 MG tablet    Sig: Take 1 tablet (25 mg total) by mouth daily. Take on tablet in the morning.    Dispense:  90 tablet    Refill:  1    Order Specific Question:   Supervising Provider    Answer:   Quentin Angst [id number]  . amLODipine (NORVASC) 10 MG tablet    Sig: Take 1 tablet (10 mg total) by mouth daily.    Dispense:  90 tablet    Refill:  1    Order Specific Question:   Supervising Provider    Answer:   Quentin Angst [id number]  . varenicline (CHANTIX STARTING MONTH PAK) 0.5 MG X 11 & 1 MG X 42 tablet  Sig: Take one 0.5 mg tablet by mouth once daily for 3 days, then increase to one 0.5 mg tablet twice daily for 4 days, then increase to one 1 mg tablet twice daily.    Dispense:  53 tablet    Refill:  0    Order Specific Question:   Supervising Provider    Answer:   Quentin Angst [id number]  . traZODone (DESYREL) 100 MG tablet    Sig: Take 1 tablet (100 mg total) by mouth at bedtime.    Dispense:  30 tablet    Refill:  2    Order Specific Question:   Supervising Provider    Answer:   Quentin Angst [id number]    Follow-up: Return in about 4 weeks (around 10/16/2016) for right ankle pain.   Loletta Specter PA

## 2016-09-18 NOTE — Patient Instructions (Signed)

## 2016-09-26 ENCOUNTER — Ambulatory Visit (HOSPITAL_COMMUNITY)
Admission: RE | Admit: 2016-09-26 | Discharge: 2016-09-26 | Disposition: A | Discharge status: Home / Self Care | Payer: Medicare Other | Source: Ambulatory Visit | Attending: Physician Assistant | Admitting: Physician Assistant

## 2016-09-26 DIAGNOSIS — M25571 Pain in right ankle and joints of right foot: Secondary | ICD-10-CM | POA: Diagnosis present

## 2016-09-26 DIAGNOSIS — G8929 Other chronic pain: Secondary | ICD-10-CM | POA: Diagnosis present

## 2016-09-27 ENCOUNTER — Encounter (INDEPENDENT_AMBULATORY_CARE_PROVIDER_SITE_OTHER): Payer: Self-pay

## 2016-10-13 ENCOUNTER — Ambulatory Visit (INDEPENDENT_AMBULATORY_CARE_PROVIDER_SITE_OTHER): Payer: Medicare Other | Admitting: Physician Assistant

## 2016-10-19 ENCOUNTER — Ambulatory Visit (INDEPENDENT_AMBULATORY_CARE_PROVIDER_SITE_OTHER): Payer: Medicare Other | Admitting: Physician Assistant

## 2016-11-08 ENCOUNTER — Ambulatory Visit (INDEPENDENT_AMBULATORY_CARE_PROVIDER_SITE_OTHER): Payer: Medicare Other | Admitting: Physician Assistant

## 2016-11-08 ENCOUNTER — Encounter (INDEPENDENT_AMBULATORY_CARE_PROVIDER_SITE_OTHER): Payer: Self-pay | Admitting: Physician Assistant

## 2016-11-08 VITALS — BP 116/83 | HR 64 | Temp 98.8°F | Wt 246.0 lb

## 2016-11-08 DIAGNOSIS — M25571 Pain in right ankle and joints of right foot: Secondary | ICD-10-CM

## 2016-11-08 DIAGNOSIS — G4700 Insomnia, unspecified: Secondary | ICD-10-CM

## 2016-11-08 DIAGNOSIS — F411 Generalized anxiety disorder: Secondary | ICD-10-CM | POA: Diagnosis not present

## 2016-11-08 DIAGNOSIS — G8929 Other chronic pain: Secondary | ICD-10-CM | POA: Diagnosis not present

## 2016-11-08 DIAGNOSIS — F17200 Nicotine dependence, unspecified, uncomplicated: Secondary | ICD-10-CM

## 2016-11-08 DIAGNOSIS — I10 Essential (primary) hypertension: Secondary | ICD-10-CM

## 2016-11-08 MED ORDER — VARENICLINE TARTRATE 1 MG PO TABS: 1.0000 mg | ORAL_TABLET | Freq: Two times a day (BID) | ORAL | 3 refills | Status: DC

## 2016-11-08 MED ORDER — TRAZODONE HCL 100 MG PO TABS: 100.0000 mg | ORAL_TABLET | Freq: Every day | ORAL | 1 refills | Status: DC

## 2016-11-08 MED ORDER — GABAPENTIN 300 MG PO CAPS: 600.0000 mg | ORAL_CAPSULE | Freq: Three times a day (TID) | ORAL | 3 refills | Status: DC

## 2016-11-08 MED ORDER — MELOXICAM 7.5 MG PO TABS: ORAL_TABLET | ORAL | 0 refills | Status: DC

## 2016-11-08 MED ORDER — ESCITALOPRAM OXALATE 20 MG PO TABS: 20.0000 mg | ORAL_TABLET | Freq: Every day | ORAL | 1 refills | Status: DC

## 2016-11-08 MED ORDER — HYDROCHLOROTHIAZIDE 25 MG PO TABS: 25.0000 mg | ORAL_TABLET | Freq: Every day | ORAL | 1 refills | Status: DC

## 2016-11-08 NOTE — Patient Instructions (Signed)
Peroneal Tendinopathy Peroneal tendinopathy is irritation of the tendons that pass behind your ankle (peroneal tendons). These tendons attach muscles in your foot to a bone on the side of your foot and underneath the arch of your foot. This condition can cause your peroneal tendons to get bigger and swell. What are the causes? This condition may be caused by:  Putting stress on your ankle over and over again (overuse injury).  A sudden injury that puts stress on your tendons, such as an ankle sprain.  What increases the risk? This condition is more likely to develop in:  People who have high arches.  Athletes who play sports that involve putting stress on the ankle over and over again. These sports include: ? Running. ? Dancing. ? Soccer. ? Basketball.  What are the signs or symptoms? Symptoms of this condition can start suddenly or develop gradually. Symptoms include:  Pain in the back of the ankle, on the side of the foot, or in the arch of the foot.  Pain that gets worse with activity and better with rest.  Swelling.  Warmth.  Weakness in your foot or ankle.  How is this diagnosed? This condition may be diagnosed based on:  Your symptoms.  Your medical history.  A physical exam.  Imaging tests, such as: ? An X-ray or CT scan to check for bone injury. ? MRI or ultrasound to check for muscle or tendon injury.  During your physical exam, your health care provider may move your foot and ankle and test the strength of your leg muscles. How is this treated? This condition may be treated by:  Keeping your body weight off your ankle for several days.  Returning gradually to full activity gradually.  Putting ice on your ankle to reduce swelling.  Taking an anti-inflammatory pain medicine (NSAID).  Having medicine injected into your tendon to reduce swelling.  Wearing a removable boot or brace for ankle support.  Doing range-of-motion exercises and strengthening  exercises (physical therapy) when pain and swelling improve.  If the condition does not improve with treatment, or if a tendon or muscle is damaged, surgery may be needed. Follow these instructions at home: If you have a boot or brace:  Wear it as told by your health care provider. Remove it only as told by your health care provider.  Loosen it if your toes tingle, become numb, or turn cold and blue.  Do not let it get wet if it is not waterproof.  Keep it clean. Managing pain, stiffness, and swelling  If directed, apply ice to the injured area: ? Put ice in a plastic bag. ? Place a towel between your skin and the bag. ? Leave the ice on for 20 minutes, 2-3 times a day.  Take over-the-counter and prescription medicines only as told by your health care provider.  Raise (elevate) your ankle above the level of your heart when resting if you have swelling. Activity  Do not use your ankle to support (bear) your full body weight until your health care provider says that you can.  Do not do activities that make pain or swelling worse.  Return to your normal activities as told by your health care provider. General instructions  Keep all follow-up visits as told by your health care provider. This is important. How is this prevented?  Wear supportive footwear that is appropriate for your athletic activity.  Avoid athletic activities that cause swelling or pain in your ankle or foot.  See   your health care provider if you have pain or swelling that does not improve after a few days of rest.  Stop training if you develop pain or swelling.  If you start a new athletic activity, start gradually to build up your strength, endurance, and flexibility. Contact a health care provider if:  Your symptoms get worse.  Your symptoms do not improve in 2-4 weeks.  You develop new, unexplained symptoms. This information is not intended to replace advice given to you by your health care  provider. Make sure you discuss any questions you have with your health care provider. Document Released: 01/30/2005 Document Revised: 10/05/2015 Document Reviewed: 12/19/2014 Elsevier Interactive Patient Education  2018 [employer]  

## 2016-11-08 NOTE — Progress Notes (Signed)
Subjective:  Patient ID: Thomas Spence, male    DOB: 11-30-1960  Age: 56 y.o. MRN: [id number]  CC: ankle pain and right shoulder pain  HPI  Thomas Spence a 56 y.o.malewith a PMH of depression, anxiety, GERD, and chronic ankle pain presents to f/u on HTN and right ankle pain. Says pain on lateral aspect of right ankle persists. Pain located along the distribution of the peroneal nerve along the lateral malleolus. Pain also radiates across the talus region and to the medial malleolar region. He went to have XR of right foot with normal radiological findings. His Gabapentin was increased on 09/18/16 which has been moderately beneficial to the patient. Pain currently 1/10, however, pain can reach 9/10. Walking and activities make the pain worse.     Patient's BP was taken twice with the second reading being 116/83. Stated he walked to clinic which may have initially elevated his BP. Amlodipine was increased on 09/18/16 from 2.5 mg to 10 mg. Says he felt as if Amlodipine 10 mg was causing him to have a heart attack, felt "pins"  in the chest area. He flushed Amlodipine down the toilet. Currently only on HCTZ 25 mg.     He was started on Escitalopram 20 mg on 09/18/16 for his anxiety. Says his anxiety is doing "great". Feels calm and "even keel". No side effects.    Trazodone was increased to 100 mg for his insomnia on 09/18/16. Reports insomnia is somewhat better and is now sleeping approximately 5 continuous hours. Wakes up and then sleeps two more hours.      Chantix starter pack started on 09/18/16. Says he has reduced his smoking to one or two cigarettes per week. Needs refills.  Outpatient Medications Prior to Visit  Medication Sig Dispense Refill  . escitalopram (LEXAPRO) 20 MG tablet Take 1 tablet (20 mg total) by mouth daily. 90 tablet 1  . gabapentin (NEURONTIN) 300 MG capsule Take 2 capsules (600 mg total) by mouth 3 (three) times daily. 180 capsule 3  . hydrochlorothiazide  (HYDRODIURIL) 25 MG tablet Take 1 tablet (25 mg total) by mouth daily. Take on tablet in the morning. 90 tablet 1  . Melatonin 5 MG TABS Take 1 tablet (5 mg total) by mouth daily. 30 tablet 2  . meloxicam (MOBIC) 7.5 MG tablet TAKE 1 TABLET(7.5 MG) BY MOUTH DAILY 90 tablet 0  . traZODone (DESYREL) 100 MG tablet Take 1 tablet (100 mg total) by mouth at bedtime. 30 tablet 2  . varenicline (CHANTIX STARTING MONTH PAK) 0.5 MG X 11 & 1 MG X 42 tablet Take one 0.5 mg tablet by mouth once daily for 3 days, then increase to one 0.5 mg tablet twice daily for 4 days, then increase to one 1 mg tablet twice daily. 53 tablet 0  . amLODipine (NORVASC) 10 MG tablet Take 1 tablet (10 mg total) by mouth daily. (Patient not taking: Reported on 11/08/2016) 90 tablet 1   No facility-administered medications prior to visit.      ROS Review of Systems  Constitutional: Negative for chills, fever and malaise/fatigue.  Eyes: Negative for blurred vision.  Respiratory: Negative for shortness of breath.   Cardiovascular: Negative for chest pain and palpitations.  Gastrointestinal: Negative for abdominal pain and nausea.  Genitourinary: Negative for dysuria and hematuria.  Musculoskeletal: Positive for joint pain. Negative for myalgias.  Skin: Negative for rash.  Neurological: Negative for tingling and headaches.  Psychiatric/Behavioral: Negative for depression. The patient is not nervous/anxious.  Objective:  Wt 246 lb (111.6 kg)   BMI 30.75 kg/m   BP/Weight 11/08/2016 09/18/2016 07/21/2016  Systolic BP - 144 142  Diastolic BP - 89 161  Wt. (Lbs) 246 248 245.6  BMI 30.75 31 30.7      Physical Exam  Constitutional: He is oriented to person, place, and time.  Well developed, overweight, NAD, polite  HENT:  Head: Normocephalic and atraumatic.  Eyes: No scleral icterus.  Neck: Normal range of motion. Neck supple. No thyromegaly present.  Cardiovascular: Normal rate, regular rhythm and normal heart sounds.    Pulmonary/Chest: Effort normal and breath sounds normal.  Musculoskeletal: He exhibits no edema.  Neurological: He is alert and oriented to person, place, and time. No cranial nerve deficit. Coordination normal.  Gait normal  Skin: Skin is warm and dry. No rash noted. No erythema. No pallor.  Psychiatric: He has a normal mood and affect. His behavior is normal. Thought content normal.  Vitals reviewed.    Assessment & Plan:    1. Chronic pain of right ankle - Continue gabapentin (NEURONTIN) 300 MG capsule; Take 2 capsules (600 mg total) by mouth 3 (three) times daily.  Dispense: 180 capsule; Refill: 3 - Continue meloxicam (MOBIC) 7.5 MG tablet; TAKE 1 TABLET(7.5 MG) BY MOUTH DAILY  Dispense: 90 tablet; Refill: 0 - AMB referral to orthopedics - MR ANKLE RIGHT WO CONTRAST; Future  2. Essential hypertension - Continue hydrochlorothiazide (HYDRODIURIL) 25 MG tablet; Take 1 tablet (25 mg total) by mouth daily. Take on tablet in the morning.  Dispense: 90 tablet; Refill: 1 - Stop Amlodipine  3. Anxiety state - Continue escitalopram (LEXAPRO) 20 MG tablet; Take 1 tablet (20 mg total) by mouth daily.  Dispense: 90 tablet; Refill: 1  4. Insomnia, unspecified type - Continue traZODone (DESYREL) 100 MG tablet; Take 1 tablet (100 mg total) by mouth at bedtime.  Dispense: 90 tablet; Refill: 1  5. Tobacco use disorder - Begin varenicline (CHANTIX CONTINUING MONTH PAK) 1 MG tablet; Take 1 tablet (1 mg total) by mouth 2 (two) times daily.  Dispense: 60 tablet; Refill: 3   Meds ordered this encounter  Medications  . gabapentin (NEURONTIN) 300 MG capsule    Sig: Take 2 capsules (600 mg total) by mouth 3 (three) times daily.    Dispense:  180 capsule    Refill:  3    Order Specific Question:   Supervising Provider    Answer:   Quentin Angst [id number]  . escitalopram (LEXAPRO) 20 MG tablet    Sig: Take 1 tablet (20 mg total) by mouth daily.    Dispense:  90 tablet    Refill:  1     Order Specific Question:   Supervising Provider    Answer:   Quentin Angst [id number]  . hydrochlorothiazide (HYDRODIURIL) 25 MG tablet    Sig: Take 1 tablet (25 mg total) by mouth daily. Take on tablet in the morning.    Dispense:  90 tablet    Refill:  1    Order Specific Question:   Supervising Provider    Answer:   Quentin Angst [id number]  . traZODone (DESYREL) 100 MG tablet    Sig: Take 1 tablet (100 mg total) by mouth at bedtime.    Dispense:  90 tablet    Refill:  1    Order Specific Question:   Supervising Provider    Answer:   Quentin Angst [id number]  . meloxicam (MOBIC) 7.5 MG tablet  Sig: TAKE 1 TABLET(7.5 MG) BY MOUTH DAILY    Dispense:  90 tablet    Refill:  0    **Patient requests 90 days supply**    Order Specific Question:   Supervising Provider    Answer:   Quentin Angst [[id number]]  . varenicline (CHANTIX CONTINUING MONTH PAK) 1 MG tablet    Sig: Take 1 tablet (1 mg total) by mouth 2 (two) times daily.    Dispense:  60 tablet    Refill:  3    Order Specific Question:   Supervising Provider    Answer:   Quentin Angst [id number]    Follow-up: Return in about 8 weeks (around 01/03/2017) for ankle pain.   Loletta Specter PA

## 2016-11-08 NOTE — Progress Notes (Signed)
Pt is no longer taking the amlodipine they made him feel like he was having a heart attack, he felt like needles were poking him in the chest, so he flushed the bottle.

## 2016-11-13 ENCOUNTER — Ambulatory Visit (HOSPITAL_COMMUNITY): Payer: Medicare Other | Attending: Physician Assistant

## 2016-11-13 ENCOUNTER — Encounter (HOSPITAL_COMMUNITY): Payer: Self-pay

## 2016-12-18 ENCOUNTER — Other Ambulatory Visit (INDEPENDENT_AMBULATORY_CARE_PROVIDER_SITE_OTHER): Payer: Self-pay | Admitting: Physician Assistant

## 2016-12-18 DIAGNOSIS — G4700 Insomnia, unspecified: Secondary | ICD-10-CM

## 2016-12-18 NOTE — Telephone Encounter (Signed)
FWD to PCP. Montez Stryker S Damond Borchers, CMA  

## 2016-12-25 ENCOUNTER — Encounter (INDEPENDENT_AMBULATORY_CARE_PROVIDER_SITE_OTHER): Payer: Self-pay | Admitting: Orthopaedic Surgery

## 2016-12-25 ENCOUNTER — Ambulatory Visit (INDEPENDENT_AMBULATORY_CARE_PROVIDER_SITE_OTHER): Payer: Medicare Other | Admitting: Orthopaedic Surgery

## 2016-12-25 DIAGNOSIS — M25571 Pain in right ankle and joints of right foot: Secondary | ICD-10-CM | POA: Diagnosis not present

## 2016-12-25 NOTE — Progress Notes (Signed)
Office Visit Note   Patient: Thomas Spence           Date of Birth: 06-Oct-1960           MRN: [id number] Visit Date: 12/25/2016              Requested by: Loletta SpecterGomez, Roger David, PA-C [id number] [place], [place] [id number] PCP: Loletta SpecterGomez, Roger David, PA-C   Assessment & Plan: Visit Diagnoses:  1. Pain in right ankle and joints of right foot     Plan: Impression is right ankle pain.  Recommend MRI to rule out structural abnormality given previous history of OCD and ankle arthroscopy.  Follow-up after the MRI.  Follow-Up Instructions: Return if symptoms worsen or fail to improve.   Orders:  Orders Placed This Encounter  Procedures  . MR Ankle Right w/o contrast   No orders of the defined types were placed in this encounter.     Procedures: No procedures performed   Clinical Data: No additional findings.   Subjective: Chief Complaint  Patient presents with  . Right Foot - Pain    Patient is a 56 year old gentleman who comes in with right ankle pain that is chronic.  He is status post right.  He takes meloxicam and Neurontin for ankle arthroscopy by Dr. Eulah PontMurphy in 2012 for an OCD lesion.  He takes meloxicam and Neurontin for the pain.  His pain is mainly across the anterior aspect of the ankle.  He has had multiple ankle injuries in the past while playing sports.  He denies any numbness and tingling.  He does not radiate.  The pain is worse with prolonged standing and activity.    Review of Systems  Constitutional: Negative.   All other systems reviewed and are negative.    Objective: Vital Signs: There were no vitals taken for this visit.  Physical Exam  Constitutional: He is oriented to person, place, and time. He appears well-developed and well-nourished.  HENT:  Head: Normocephalic and atraumatic.  Eyes: Pupils are equal, round, and reactive to light.  Neck: Neck supple.  Pulmonary/Chest: Effort normal.  Abdominal: Soft.  Musculoskeletal: Normal  range of motion.  Neurological: He is alert and oriented to person, place, and time.  Skin: Skin is warm.  Psychiatric: He has a normal mood and affect. His behavior is normal. Judgment and thought content normal.  Nursing note and vitals reviewed.   Ortho Exam Right ankle exam shows no joint effusion.  He has a fully healed surgical scars.  He has pain of the ATFL ligament.  Ankle and subtalar joints are stable. Specialty Comments:  No specialty comments available.  Imaging: No results found.   PMFS History: Patient Active Problem List   Diagnosis Date Noted  . Chronic pain of right ankle 09/18/2016  . GERD (gastroesophageal reflux disease) 03/24/2013  . Anxiety state 03/24/2013  . Hypertension 10/21/2012  . Chronic pain syndrome 10/21/2012   Past Medical History:  Diagnosis Date  . Depression   . GERD (gastroesophageal reflux disease)   . Neuromuscular disorder (HCC)   . Osteoporosis     Family History  Problem Relation Age of Onset  . Multiple sclerosis Mother   . Osteoporosis Mother   . Arthritis Mother        Rheumatoid  . Hypertension Mother   . Hyperlipidemia Mother   . Diabetes Father   . Hypertension Father   . Hyperlipidemia Father     Past Surgical History:  Procedure Laterality  Date  . COLON SURGERY    . ROTATOR CUFF REPAIR     Social History   Occupational History  . Not on file  Tobacco Use  . Smoking status: Current Every Day Smoker    Packs/day: 10.00    Years: 25.00    Pack years: 250.00    Types: Cigarettes  . Smokeless tobacco: Never Used  . Tobacco comment: states he cut back to 1 cigarette/week  Substance and Sexual Activity  . Alcohol use: No  . Drug use: No  . Sexual activity: Not on file

## 2017-01-02 ENCOUNTER — Other Ambulatory Visit: Payer: Self-pay

## 2017-01-02 ENCOUNTER — Ambulatory Visit (INDEPENDENT_AMBULATORY_CARE_PROVIDER_SITE_OTHER): Payer: Medicare Other | Admitting: Physician Assistant

## 2017-01-02 ENCOUNTER — Encounter (INDEPENDENT_AMBULATORY_CARE_PROVIDER_SITE_OTHER): Payer: Self-pay | Admitting: Physician Assistant

## 2017-01-02 VITALS — BP 133/84 | HR 74 | Temp 98.4°F | Wt 249.8 lb

## 2017-01-02 DIAGNOSIS — F17200 Nicotine dependence, unspecified, uncomplicated: Secondary | ICD-10-CM | POA: Diagnosis not present

## 2017-01-02 DIAGNOSIS — G4700 Insomnia, unspecified: Secondary | ICD-10-CM | POA: Diagnosis not present

## 2017-01-02 DIAGNOSIS — M25571 Pain in right ankle and joints of right foot: Secondary | ICD-10-CM

## 2017-01-02 DIAGNOSIS — I10 Essential (primary) hypertension: Secondary | ICD-10-CM | POA: Diagnosis not present

## 2017-01-02 DIAGNOSIS — Z1211 Encounter for screening for malignant neoplasm of colon: Secondary | ICD-10-CM

## 2017-01-02 MED ORDER — TRAZODONE HCL 100 MG PO TABS: 200.0000 mg | ORAL_TABLET | Freq: Every day | ORAL | 5 refills | Status: DC

## 2017-01-02 MED ORDER — VARENICLINE TARTRATE 1 MG PO TABS: 1.0000 mg | ORAL_TABLET | Freq: Two times a day (BID) | ORAL | 3 refills | Status: DC

## 2017-01-02 NOTE — Progress Notes (Signed)
Subjective:  Patient ID: Thomas Spence, male    DOB: 1960/12/25  Age: 56 y.o. MRN: [id number]  CC: f/u ankle pain, medication refill  HPI Thomas Spence is a 56 y.o. male with a medical history of depression, anxiety, GERD, and chronic ankle pain presents to f/u on right ankle pain. Pain located along the distribution of the peroneal nerve along the lateral malleolus. Pain also radiates across the talus region and to the medial malleolar region. He went to have XR of right foot with normal radiological findings.  Has been to orthopedics and was recommended to have an MRI that is scheduled for 01/11/17. F/u with orthopedics next week after MRI. No pain in the ankle today.    Request refill for Trazodone 100 mg. The request is too soon and he admits to taking 200-300 mg per night on occasion. He continues to ruminate but does not know about what specifically. Difficult for him to initiate and maintain sleep.     Taking antihypertensives as directed. Finished Chantix and feels it was very helpful in resisting urge to smoke. Requests a refill for Chantix.        Outpatient Medications Prior to Visit  Medication Sig Dispense Refill  . escitalopram (LEXAPRO) 20 MG tablet Take 1 tablet (20 mg total) by mouth daily. 90 tablet 1  . gabapentin (NEURONTIN) 300 MG capsule Take 2 capsules (600 mg total) by mouth 3 (three) times daily. 180 capsule 3  . hydrochlorothiazide (HYDRODIURIL) 25 MG tablet Take 1 tablet (25 mg total) by mouth daily. Take on tablet in the morning. 90 tablet 1  . Melatonin 5 MG TABS Take 1 tablet (5 mg total) by mouth daily. 30 tablet 2  . meloxicam (MOBIC) 7.5 MG tablet TAKE 1 TABLET(7.5 MG) BY MOUTH DAILY 90 tablet 0  . traZODone (DESYREL) 100 MG tablet Take 1 tablet (100 mg total) by mouth at bedtime. 90 tablet 1  . varenicline (CHANTIX CONTINUING MONTH PAK) 1 MG tablet Take 1 tablet (1 mg total) by mouth 2 (two) times daily. 60 tablet 3   No facility-administered  medications prior to visit.      ROS Review of Systems  Constitutional: Negative for chills, fever and malaise/fatigue.  Eyes: Negative for blurred vision.  Respiratory: Negative for shortness of breath.   Cardiovascular: Negative for chest pain and palpitations.  Gastrointestinal: Negative for abdominal pain and nausea.  Genitourinary: Negative for dysuria and hematuria.  Musculoskeletal: Negative for joint pain and myalgias.  Skin: Negative for rash.  Neurological: Negative for tingling and headaches.  Psychiatric/Behavioral: Negative for depression. The patient is not nervous/anxious.     Objective:  BP (!) 153/94 (BP Location: Left Arm, Patient Position: Sitting, Cuff Size: Large)   Pulse 74   Temp 98.4 F (36.9 C) (Oral)   Wt 249 lb 12.8 oz (113.3 kg)   SpO2 95%   BMI 31.22 kg/m   BP/Weight 01/02/2017 11/08/2016 09/18/2016  Systolic BP 153 116 144  Diastolic BP 94 83 89  Wt. (Lbs) 249.8 246 248  BMI 31.22 30.75 31      Physical Exam  Constitutional: He is oriented to person, place, and time.  Well developed, overweight, well groomed, NAD, polite  HENT:  Head: Normocephalic and atraumatic.  Eyes: Conjunctivae are normal. No scleral icterus.  Neck: Normal range of motion. Neck supple. No thyromegaly present.  Cardiovascular: Normal rate, regular rhythm and normal heart sounds.  Pulmonary/Chest: Effort normal and breath sounds normal.  Musculoskeletal: He  exhibits no edema.  No TTP of the right ankle  Neurological: He is alert and oriented to person, place, and time. No cranial nerve deficit. Coordination normal.  Skin: Skin is warm and dry. No rash noted. No erythema. No pallor.  Psychiatric: He has a normal mood and affect. His behavior is normal. Thought content normal.  Vitals reviewed.    Assessment & Plan:   1. Pain in joint involving right ankle and foot - Keep appt for MRI and orthopedic specialist  2. Essential hypertension - Continue on HCTZ  3.  Insomnia, unspecified type - Increase traZODone (DESYREL) 100 MG tablet; Take 2 tablets (200 mg total) by mouth at bedtime.  Dispense: 60 tablet; Refill: 5  4. Tobacco use disorder - Refill varenicline (CHANTIX CONTINUING MONTH PAK) 1 MG tablet; Take 1 tablet (1 mg total) by mouth 2 (two) times daily.  Dispense: 60 tablet; Refill: 3  5. Special screening for malignant neoplasms, colon - Fecal occult blood, imunochemical   Meds ordered this encounter  Medications  . varenicline (CHANTIX CONTINUING MONTH PAK) 1 MG tablet    Sig: Take 1 tablet (1 mg total) by mouth 2 (two) times daily.    Dispense:  60 tablet    Refill:  3    Order Specific Question:   Supervising Provider    Answer:   Quentin AngstJEGEDE, OLUGBEMIGA E [id number]  . traZODone (DESYREL) 100 MG tablet    Sig: Take 2 tablets (200 mg total) by mouth at bedtime.    Dispense:  60 tablet    Refill:  5    Order Specific Question:   Supervising Provider    Answer:   Quentin AngstJEGEDE, OLUGBEMIGA E [id number]    Follow-up: Return in about 3 months (around 04/04/2017) for Insomnia.   Loletta Specteroger David Rylann Munford PA

## 2017-01-02 NOTE — Patient Instructions (Signed)
Trazodone tablets What is this medicine? TRAZODONE (TRAZ oh done) is used to treat depression. This medicine may be used for other purposes; ask your health care provider or pharmacist if you have questions. COMMON BRAND NAME(S): Desyrel What should I tell my health care provider before I take this medicine? They need to know if you have any of these conditions: -attempted suicide or thinking about it -bipolar disorder -bleeding problems -glaucoma -heart disease, or previous heart attack -irregular heart beat -kidney or liver disease -low levels of sodium in the blood -an unusual or allergic reaction to trazodone, other medicines, foods, dyes or preservatives -pregnant or trying to get pregnant -breast-feeding How should I use this medicine? Take this medicine by mouth with a glass of water. Follow the directions on the prescription label. Take this medicine shortly after a meal or a light snack. Take your medicine at regular intervals. Do not take your medicine more often than directed. Do not stop taking this medicine suddenly except upon the advice of your doctor. Stopping this medicine too quickly may cause serious side effects or your condition may worsen. A special MedGuide will be given to you by the pharmacist with each prescription and refill. Be sure to read this information carefully each time. Talk to your pediatrician regarding the use of this medicine in children. Special care may be needed. Overdosage: If you think you have taken too much of this medicine contact a poison control center or emergency room at once. NOTE: This medicine is only for you. Do not share this medicine with others. What if I miss a dose? If you miss a dose, take it as soon as you can. If it is almost time for your next dose, take only that dose. Do not take double or extra doses. What may interact with this medicine? Do not take this medicine with any of the following medications: -certain medicines  for fungal infections like fluconazole, itraconazole, ketoconazole, posaconazole, voriconazole -cisapride -dofetilide -dronedarone -linezolid -MAOIs like Carbex, Eldepryl, Marplan, Nardil, and Parnate -mesoridazine -methylene blue (injected into a vein) -pimozide -saquinavir -thioridazine -ziprasidone This medicine may also interact with the following medications: -alcohol -antiviral medicines for HIV or AIDS -aspirin and aspirin-like medicines -barbiturates like phenobarbital -certain medicines for blood pressure, heart disease, irregular heart beat -certain medicines for depression, anxiety, or psychotic disturbances -certain medicines for migraine headache like almotriptan, eletriptan, frovatriptan, naratriptan, rizatriptan, sumatriptan, zolmitriptan -certain medicines for seizures like carbamazepine and phenytoin -certain medicines for sleep -certain medicines that treat or prevent blood clots like dalteparin, enoxaparin, warfarin -digoxin -fentanyl -lithium -NSAIDS, medicines for pain and inflammation, like ibuprofen or naproxen -other medicines that prolong the QT interval (cause an abnormal heart rhythm) -rasagiline -supplements like St. John's wort, kava kava, valerian -tramadol -tryptophan This list may not describe all possible interactions. Give your health care provider a list of all the medicines, herbs, non-prescription drugs, or dietary supplements you use. Also tell them if you smoke, drink alcohol, or use illegal drugs. Some items may interact with your medicine. What should I watch for while using this medicine? Tell your doctor if your symptoms do not get better or if they get worse. Visit your doctor or health care professional for regular checks on your progress. Because it may take several weeks to see the full effects of this medicine, it is important to continue your treatment as prescribed by your doctor. Patients and their families should watch out for new  or worsening thoughts of suicide or depression. Also   watch out for sudden changes in feelings such as feeling anxious, agitated, panicky, irritable, hostile, aggressive, impulsive, severely restless, overly excited and hyperactive, or not being able to sleep. If this happens, especially at the beginning of treatment or after a change in dose, call your health care professional. You may get drowsy or dizzy. Do not drive, use machinery, or do anything that needs mental alertness until you know how this medicine affects you. Do not stand or sit up quickly, especially if you are an older patient. This reduces the risk of dizzy or fainting spells. Alcohol may interfere with the effect of this medicine. Avoid alcoholic drinks. This medicine may cause dry eyes and blurred vision. If you wear contact lenses you may feel some discomfort. Lubricating drops may help. See your eye doctor if the problem does not go away or is severe. Your mouth may get dry. Chewing sugarless gum, sucking hard candy and drinking plenty of water may help. Contact your doctor if the problem does not go away or is severe. What side effects may I notice from receiving this medicine? Side effects that you should report to your doctor or health care professional as soon as possible: -allergic reactions like skin rash, itching or hives, swelling of the face, lips, or tongue -elevated mood, decreased need for sleep, racing thoughts, impulsive behavior -confusion -fast, irregular heartbeat -feeling faint or lightheaded, falls -feeling agitated, angry, or irritable -loss of balance or coordination -painful or prolonged erections -restlessness, pacing, inability to keep still -suicidal thoughts or other mood changes -tremors -trouble sleeping -seizures -unusual bleeding or bruising Side effects that usually do not require medical attention (report to your doctor or health care professional if they continue or are bothersome): -change in  sex drive or performance -change in appetite or weight -constipation -headache -muscle aches or pains -nausea This list may not describe all possible side effects. Call your doctor for medical advice about side effects. You may report side effects to FDA at 1-800-FDA-1088. Where should I keep my medicine? Keep out of the reach of children. Store at room temperature between 15 and 30 degrees C (59 to 86 degrees F). Protect from light. Keep container tightly closed. Throw away any unused medicine after the expiration date. NOTE: This sheet is a summary. It may not cover all possible information. If you have questions about this medicine, talk to your doctor, pharmacist, or health care provider.  2018 Elsevier/Gold Standard (2015-07-01 16:57:05)  

## 2017-01-06 LAB — FECAL OCCULT BLOOD, IMMUNOCHEMICAL: Fecal Occult Bld: NEGATIVE

## 2017-01-07 ENCOUNTER — Other Ambulatory Visit: Payer: Medicare Other

## 2017-01-08 ENCOUNTER — Telehealth (INDEPENDENT_AMBULATORY_CARE_PROVIDER_SITE_OTHER): Payer: Self-pay

## 2017-01-08 NOTE — Telephone Encounter (Signed)
Patient aware of negative occult blood. Tempestt S Roberts, CMA  

## 2017-01-08 NOTE — Telephone Encounter (Signed)
-----   Message from Loletta Specteroger David Gomez, PA-C sent at 01/08/2017  1:47 PM EST ----- Negative fecal occult blood test.

## 2017-01-11 ENCOUNTER — Ambulatory Visit
Admission: RE | Admit: 2017-01-11 | Discharge: 2017-01-11 | Disposition: A | Discharge status: Home / Self Care | Payer: Medicare Other | Source: Ambulatory Visit | Attending: Orthopaedic Surgery | Admitting: Orthopaedic Surgery

## 2017-01-11 DIAGNOSIS — M25571 Pain in right ankle and joints of right foot: Secondary | ICD-10-CM

## 2017-01-16 ENCOUNTER — Ambulatory Visit (INDEPENDENT_AMBULATORY_CARE_PROVIDER_SITE_OTHER): Payer: Medicare Other | Admitting: Orthopaedic Surgery

## 2017-01-16 ENCOUNTER — Encounter (INDEPENDENT_AMBULATORY_CARE_PROVIDER_SITE_OTHER): Payer: Self-pay | Admitting: Orthopaedic Surgery

## 2017-01-16 DIAGNOSIS — M25571 Pain in right ankle and joints of right foot: Secondary | ICD-10-CM | POA: Diagnosis not present

## 2017-01-16 NOTE — Progress Notes (Signed)
   Office Visit Note   Patient: Thomas Spence           Date of Birth: 1960/02/19           MRN: [id number] Visit Date: 01/16/2017              Requested by: Loletta SpecterGomez, Roger David, PA-C [address] [place], [place] [id number] PCP: Loletta SpecterGomez, Roger David, PA-C   Assessment & Plan: Visit Diagnoses:  1. 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  Patient has had a prior right ankle arthroscopy.  Referral to Dr. Lajoyce Cornersuda for further evaluation and treatment and possible surgery if indicated.  Follow-Up Instructions: Return for needs appt with Dr. Lajoyce Cornersuda.   Orders:  No orders of the defined types were placed in this encounter.  No orders of the defined types were placed in this encounter.     Procedures: No procedures performed   Clinical Data: No additional findings.   Subjective: Chief Complaint  Patient presents with  . Left Ankle - Pain, Follow-up    Patient follows up today for his MRI.  He has anterior ankle pain that wraps around laterally.  He continues to have symptoms of his ankles are no better.  He did undergo prior right ankle arthroscopy with Dr. Richardson Landryan Murphy    Review of Systems   Objective: Vital Signs: There were no vitals taken for this visit.  Physical Exam  Ortho Exam Exam is stable Specialty Comments:  No specialty comments available.  Imaging: No results found.   PMFS History: Patient Active Problem List   Diagnosis Date Noted  . Chronic pain of right ankle 09/18/2016  . GERD (gastroesophageal reflux disease) 03/24/2013  . Anxiety state 03/24/2013  . Hypertension 10/21/2012  . Chronic pain syndrome 10/21/2012   Past Medical History:  Diagnosis Date  . Depression   . GERD (gastroesophageal reflux disease)   . Neuromuscular disorder (HCC)   . Osteoporosis     Family History  Problem Relation Age of Onset  . Multiple  sclerosis Mother   . Osteoporosis Mother   . Arthritis Mother        Rheumatoid  . Hypertension Mother   . Hyperlipidemia Mother   . Diabetes Father   . Hypertension Father   . Hyperlipidemia Father     Past Surgical History:  Procedure Laterality Date  . COLON SURGERY    . ROTATOR CUFF REPAIR     Social History   Occupational History  . Not on file  Tobacco Use  . Smoking status: Current Every Day Smoker    Packs/day: 10.00    Years: 25.00    Pack years: 250.00    Types: Cigarettes  . Smokeless tobacco: Never Used  . Tobacco comment: states he cut back to 1 cigarette/week  Substance and Sexual Activity  . Alcohol use: No  . Drug use: No  . Sexual activity: Not on file

## 2017-02-04 ENCOUNTER — Encounter (HOSPITAL_COMMUNITY): Payer: Self-pay | Admitting: Emergency Medicine

## 2017-02-04 ENCOUNTER — Emergency Department (HOSPITAL_COMMUNITY): Payer: No Typology Code available for payment source

## 2017-02-04 ENCOUNTER — Emergency Department (HOSPITAL_COMMUNITY)
Admission: EM | Admit: 2017-02-04 | Discharge: 2017-02-04 | Disposition: A | Discharge status: Home / Self Care | Payer: No Typology Code available for payment source | Attending: Emergency Medicine | Admitting: Emergency Medicine

## 2017-02-04 ENCOUNTER — Other Ambulatory Visit: Payer: Self-pay

## 2017-02-04 DIAGNOSIS — Z79899 Other long term (current) drug therapy: Secondary | ICD-10-CM | POA: Diagnosis not present

## 2017-02-04 DIAGNOSIS — I10 Essential (primary) hypertension: Secondary | ICD-10-CM | POA: Diagnosis not present

## 2017-02-04 DIAGNOSIS — F329 Major depressive disorder, single episode, unspecified: Secondary | ICD-10-CM | POA: Diagnosis not present

## 2017-02-04 DIAGNOSIS — F17210 Nicotine dependence, cigarettes, uncomplicated: Secondary | ICD-10-CM | POA: Diagnosis not present

## 2017-02-04 DIAGNOSIS — Z789 Other specified health status: Secondary | ICD-10-CM

## 2017-02-04 DIAGNOSIS — S161XXA Strain of muscle, fascia and tendon at neck level, initial encounter: Secondary | ICD-10-CM | POA: Insufficient documentation

## 2017-02-04 DIAGNOSIS — S199XXA Unspecified injury of neck, initial encounter: Secondary | ICD-10-CM | POA: Diagnosis present

## 2017-02-04 DIAGNOSIS — S2020XA Contusion of thorax, unspecified, initial encounter: Secondary | ICD-10-CM | POA: Diagnosis not present

## 2017-02-04 DIAGNOSIS — F419 Anxiety disorder, unspecified: Secondary | ICD-10-CM | POA: Diagnosis not present

## 2017-02-04 DIAGNOSIS — Y9389 Activity, other specified: Secondary | ICD-10-CM | POA: Diagnosis not present

## 2017-02-04 DIAGNOSIS — Y92410 Unspecified street and highway as the place of occurrence of the external cause: Secondary | ICD-10-CM | POA: Diagnosis not present

## 2017-02-04 DIAGNOSIS — M25571 Pain in right ankle and joints of right foot: Secondary | ICD-10-CM

## 2017-02-04 DIAGNOSIS — Y998 Other external cause status: Secondary | ICD-10-CM | POA: Insufficient documentation

## 2017-02-04 DIAGNOSIS — G8929 Other chronic pain: Secondary | ICD-10-CM

## 2017-02-04 DIAGNOSIS — S20219A Contusion of unspecified front wall of thorax, initial encounter: Secondary | ICD-10-CM

## 2017-02-04 LAB — BASIC METABOLIC PANEL
Anion gap: 9 (ref 5–15)
BUN: 17 mg/dL (ref 6–20)
CALCIUM: 9.2 mg/dL (ref 8.9–10.3)
CO2: 24 mmol/L (ref 22–32)
CREATININE: 1.56 mg/dL — AB (ref 0.61–1.24)
Chloride: 103 mmol/L (ref 101–111)
GFR calc Af Amer: 56 mL/min — ABNORMAL LOW (ref >60)
GFR, EST NON AFRICAN AMERICAN: 48 mL/min — AB (ref >60)
Glucose, Bld: 99 mg/dL (ref 65–99)
Potassium: 3.6 mmol/L (ref 3.5–5.1)
SODIUM: 136 mmol/L (ref 135–145)

## 2017-02-04 LAB — CBC
HCT: 43.9 % (ref 39.0–52.0)
Hemoglobin: 15.9 g/dL (ref 13.0–17.0)
MCH: 32.5 pg (ref 26.0–34.0)
MCHC: 36.2 g/dL — ABNORMAL HIGH (ref 30.0–36.0)
MCV: 89.8 fL (ref 78.0–100.0)
PLATELETS: 232 10*3/uL (ref 150–400)
RBC: 4.89 MIL/uL (ref 4.22–5.81)
RDW: 13.3 % (ref 11.5–15.5)
WBC: 12.7 10*3/uL — AB (ref 4.0–10.5)

## 2017-02-04 MED ORDER — MELOXICAM 7.5 MG PO TABS: ORAL_TABLET | ORAL | 0 refills | Status: DC

## 2017-02-04 MED ORDER — CYCLOBENZAPRINE HCL 10 MG PO TABS: 10.0000 mg | ORAL_TABLET | Freq: Two times a day (BID) | ORAL | 0 refills | Status: DC | PRN

## 2017-02-04 MED ORDER — TRAMADOL HCL 50 MG PO TABS: 50.0000 mg | ORAL_TABLET | Freq: Four times a day (QID) | ORAL | 0 refills | Status: DC | PRN

## 2017-02-04 MED ORDER — IOPAMIDOL (ISOVUE-300) INJECTION 61%
INTRAVENOUS | Status: AC
  Administered 2017-02-04: 80 mL
  Filled 2017-02-04: qty 100

## 2017-02-04 MED ORDER — HYDROMORPHONE HCL 1 MG/ML IJ SOLN
1.0000 mg | Freq: Once | INTRAMUSCULAR | Status: AC
  Administered 2017-02-04: 1 mg via INTRAVENOUS
  Filled 2017-02-04: qty 1

## 2017-02-04 MED ORDER — ONDANSETRON HCL 4 MG/2ML IJ SOLN
4.0000 mg | Freq: Once | INTRAMUSCULAR | Status: AC
  Administered 2017-02-04: 4 mg via INTRAVENOUS
  Filled 2017-02-04: qty 2
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
## 2017-02-04 NOTE — ED Notes (Signed)
Pt. To CT via stretcher. 

## 2017-02-04 NOTE — ED Notes (Signed)
Patient returned to room from X-ray 

## 2017-02-04 NOTE — ED Notes (Signed)
Patient transported to X-ray 
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
## 2017-02-07 ENCOUNTER — Telehealth (INDEPENDENT_AMBULATORY_CARE_PROVIDER_SITE_OTHER): Payer: Self-pay | Admitting: Physician Assistant
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
## 2017-02-21 ENCOUNTER — Other Ambulatory Visit: Payer: Self-pay

## 2017-02-21 ENCOUNTER — Ambulatory Visit (INDEPENDENT_AMBULATORY_CARE_PROVIDER_SITE_OTHER): Payer: Medicare Other | Admitting: Physician Assistant

## 2017-02-21 ENCOUNTER — Encounter (INDEPENDENT_AMBULATORY_CARE_PROVIDER_SITE_OTHER): Payer: Self-pay | Admitting: Physician Assistant

## 2017-02-21 ENCOUNTER — Emergency Department (HOSPITAL_COMMUNITY)
Admission: EM | Admit: 2017-02-21 | Discharge: 2017-02-22 | Disposition: A | Discharge status: Home / Self Care | Payer: Medicare Other | Attending: Emergency Medicine | Admitting: Emergency Medicine

## 2017-02-21 ENCOUNTER — Emergency Department (HOSPITAL_COMMUNITY): Payer: Medicare Other

## 2017-02-21 ENCOUNTER — Encounter (HOSPITAL_COMMUNITY): Payer: Self-pay | Admitting: Emergency Medicine

## 2017-02-21 VITALS — BP 147/96 | HR 84 | Temp 98.0°F | Wt 260.6 lb

## 2017-02-21 DIAGNOSIS — M542 Cervicalgia: Secondary | ICD-10-CM | POA: Diagnosis not present

## 2017-02-21 DIAGNOSIS — R9431 Abnormal electrocardiogram [ECG] [EKG]: Secondary | ICD-10-CM | POA: Diagnosis not present

## 2017-02-21 DIAGNOSIS — R0789 Other chest pain: Secondary | ICD-10-CM | POA: Diagnosis present

## 2017-02-21 DIAGNOSIS — R079 Chest pain, unspecified: Secondary | ICD-10-CM | POA: Diagnosis not present

## 2017-02-21 DIAGNOSIS — M549 Dorsalgia, unspecified: Secondary | ICD-10-CM | POA: Diagnosis not present

## 2017-02-21 DIAGNOSIS — Z79899 Other long term (current) drug therapy: Secondary | ICD-10-CM | POA: Diagnosis not present

## 2017-02-21 DIAGNOSIS — Z87891 Personal history of nicotine dependence: Secondary | ICD-10-CM | POA: Diagnosis not present

## 2017-02-21 DIAGNOSIS — I10 Essential (primary) hypertension: Secondary | ICD-10-CM | POA: Diagnosis not present

## 2017-02-21 LAB — BASIC METABOLIC PANEL
ANION GAP: 10 (ref 5–15)
BUN: 7 mg/dL (ref 6–20)
CALCIUM: 9.4 mg/dL (ref 8.9–10.3)
CO2: 25 mmol/L (ref 22–32)
Chloride: 102 mmol/L (ref 101–111)
Creatinine, Ser: 1.26 mg/dL — ABNORMAL HIGH (ref 0.61–1.24)
Glucose, Bld: 126 mg/dL — ABNORMAL HIGH (ref 65–99)
POTASSIUM: 3.6 mmol/L (ref 3.5–5.1)
Sodium: 137 mmol/L (ref 135–145)

## 2017-02-21 LAB — CBC
HEMATOCRIT: 45.2 % (ref 39.0–52.0)
HEMOGLOBIN: 15.4 g/dL (ref 13.0–17.0)
MCH: 31 pg (ref 26.0–34.0)
MCHC: 34.1 g/dL (ref 30.0–36.0)
MCV: 91.1 fL (ref 78.0–100.0)
Platelets: 231 10*3/uL (ref 150–400)
RBC: 4.96 MIL/uL (ref 4.22–5.81)
RDW: 13.7 % (ref 11.5–15.5)
WBC: 8.5 10*3/uL (ref 4.0–10.5)

## 2017-02-21 LAB — I-STAT TROPONIN, ED: TROPONIN I, POC: 0 ng/mL (ref 0.00–0.08)

## 2017-02-21 NOTE — Progress Notes (Signed)
Pt states it hurts for him to cough, burp, sneeze or hiccup  Pt states he feels a click in his chest area, every time he moves feels like the bones on the left side of his chest are rubbing together Pt states he hurts to lay down and hurts to sit up
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
## 2017-02-21 NOTE — Patient Instructions (Signed)
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
## 2017-02-22 MED ORDER — OXYCODONE-ACETAMINOPHEN 5-325 MG PO TABS: 1.0000 | ORAL_TABLET | ORAL | 0 refills | Status: DC | PRN

## 2017-02-22 NOTE — Discharge Instructions (Signed)
Apply ice several times a day. °
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
## 2017-03-01 ENCOUNTER — Ambulatory Visit (INDEPENDENT_AMBULATORY_CARE_PROVIDER_SITE_OTHER): Payer: Medicare Other | Admitting: Physician Assistant

## 2017-03-01 ENCOUNTER — Encounter (INDEPENDENT_AMBULATORY_CARE_PROVIDER_SITE_OTHER): Payer: Self-pay | Admitting: Physician Assistant

## 2017-03-01 ENCOUNTER — Other Ambulatory Visit (INDEPENDENT_AMBULATORY_CARE_PROVIDER_SITE_OTHER): Payer: Self-pay | Admitting: Physician Assistant

## 2017-03-01 ENCOUNTER — Other Ambulatory Visit: Payer: Self-pay

## 2017-03-01 VITALS — BP 131/88 | HR 84 | Temp 98.0°F | Wt 263.2 lb

## 2017-03-01 DIAGNOSIS — R0789 Other chest pain: Secondary | ICD-10-CM | POA: Diagnosis not present

## 2017-03-01 DIAGNOSIS — R9431 Abnormal electrocardiogram [ECG] [EKG]: Secondary | ICD-10-CM | POA: Diagnosis not present

## 2017-03-01 DIAGNOSIS — F319 Bipolar disorder, unspecified: Secondary | ICD-10-CM | POA: Diagnosis not present

## 2017-03-01 MED ORDER — HYDROCODONE-ACETAMINOPHEN 5-325 MG PO TABS: 1.0000 | ORAL_TABLET | Freq: Two times a day (BID) | ORAL | 0 refills | Status: DC | PRN

## 2017-03-01 MED ORDER — RISPERIDONE 2 MG PO TABS: 2.0000 mg | ORAL_TABLET | Freq: Every day | ORAL | 1 refills | Status: AC

## 2017-03-01 MED ORDER — MELOXICAM 7.5 MG PO TABS: ORAL_TABLET | ORAL | 0 refills | Status: DC
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
## 2017-03-01 NOTE — Patient Instructions (Signed)
Echocardiogram An echocardiogram, or echocardiography, uses sound waves (ultrasound) to produce an image of your heart. The echocardiogram is simple, painless, obtained within a short period of time, and offers valuable information to your health care provider. The images from an echocardiogram can provide information such as:  Evidence of coronary artery disease (CAD).  Heart size.  Heart muscle function.  Heart valve function.  Aneurysm detection.  Evidence of a past heart attack.  Fluid buildup around the heart.  Heart muscle thickening.  Assess heart valve function.  Tell a health care provider about:  Any allergies you have.  All medicines you are taking, including vitamins, herbs, eye drops, creams, and over-the-counter medicines.  Any problems you or family members have had with anesthetic medicines.  Any blood disorders you have.  Any surgeries you have had.  Any medical conditions you have.  Whether you are pregnant or may be pregnant. What happens before the procedure? No special preparation is needed. Eat and drink normally. What happens during the procedure?  In order to produce an image of your heart, gel will be applied to your chest and a wand-like tool (transducer) will be moved over your chest. The gel will help transmit the sound waves from the transducer. The sound waves will harmlessly bounce off your heart to allow the heart images to be captured in real-time motion. These images will then be recorded.  You may need an IV to receive a medicine that improves the quality of the pictures. What happens after the procedure? You may return to your normal schedule including diet, activities, and medicines, unless your health care provider tells you otherwise. This information is not intended to replace advice given to you by your health care provider. Make sure you discuss any questions you have with your health care provider. Document Released: 01/28/2000  Document Revised: 09/18/2015 Document Reviewed: 10/07/2012 Elsevier Interactive Patient Education  2017 [employer]  

## 2017-03-06 ENCOUNTER — Inpatient Hospital Stay (INDEPENDENT_AMBULATORY_CARE_PROVIDER_SITE_OTHER): Payer: Medicare Other | Admitting: Physician Assistant

## 2017-04-04 ENCOUNTER — Ambulatory Visit (INDEPENDENT_AMBULATORY_CARE_PROVIDER_SITE_OTHER): Payer: Medicare Other | Admitting: Physician Assistant

## 2017-04-04 ENCOUNTER — Encounter (INDEPENDENT_AMBULATORY_CARE_PROVIDER_SITE_OTHER): Payer: Self-pay | Admitting: Physician Assistant

## 2017-04-04 VITALS — BP 132/89 | HR 70 | Temp 98.0°F | Resp 20 | Ht 75.0 in | Wt 260.0 lb

## 2017-04-04 DIAGNOSIS — G8929 Other chronic pain: Secondary | ICD-10-CM

## 2017-04-04 DIAGNOSIS — M545 Low back pain: Secondary | ICD-10-CM | POA: Diagnosis not present

## 2017-04-04 DIAGNOSIS — I10 Essential (primary) hypertension: Secondary | ICD-10-CM

## 2017-04-04 DIAGNOSIS — M25571 Pain in right ankle and joints of right foot: Secondary | ICD-10-CM | POA: Diagnosis not present

## 2017-04-04 DIAGNOSIS — R0789 Other chest pain: Secondary | ICD-10-CM

## 2017-04-04 MED ORDER — HYDROCHLOROTHIAZIDE 25 MG PO TABS: 25.0000 mg | ORAL_TABLET | Freq: Every day | ORAL | 1 refills | Status: DC

## 2017-04-04 MED ORDER — ACETAMINOPHEN-CODEINE #3 300-30 MG PO TABS: 1.0000 | ORAL_TABLET | Freq: Two times a day (BID) | ORAL | 0 refills | Status: DC

## 2017-04-04 NOTE — Progress Notes (Signed)
Subjective:  Patient ID: Thomas Spence, male    DOB: 09/14/1960  Age: 57 y.o. MRN: [id number]  CC:   HPI Thomas RochMaurice D Garrisonis a 57 y.o.malewith a medical history of depression, GERD, osteoporosis, and neuromuscular disorder presents for f/u of chest wall pain. Says he is slowly feeling better but pain is aggravated by coughing or sneezing.     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 Pt was to return to see Dr. Lajoyce Cornersuda to discuss possible surgery but patient had never returned or heard from them.     Was prescribed Risperidone 2mg  one month ago but did not fill. Has been taking taking escitalopram 20 mg. Feels escitalopram is somewhat helpful but is still irritable at times.     Complains of chronic lower back pain. Goes to chiropractor with moderate relief of back pain. Does not want to have injections into the back for pain relief.       Outpatient Medications Prior to Visit  Medication Sig Dispense Refill  . escitalopram (LEXAPRO) 20 MG tablet Take 1 tablet (20 mg total) by mouth daily. 90 tablet 1  . gabapentin (NEURONTIN) 300 MG capsule Take 2 capsules (600 mg total) by mouth 3 (three) times daily. 180 capsule 3  . hydrochlorothiazide (HYDRODIURIL) 25 MG tablet Take 1 tablet (25 mg total) by mouth daily. Take on tablet in the morning. 90 tablet 1  . HYDROcodone-acetaminophen (NORCO/VICODIN) 5-325 MG tablet Take 1 tablet by mouth every 12 (twelve) hours as needed for moderate pain. 20 tablet 0  . Melatonin 5 MG TABS Take 1 tablet (5 mg total) by mouth daily. 30 tablet 2  . meloxicam (MOBIC) 7.5 MG tablet TAKE 1 TABLET(7.5 MG) BY MOUTH DAILY 14 tablet 0  . risperiDONE (RISPERDAL) 2 MG tablet Take 1 tablet (2 mg total) by mouth at bedtime. 30 tablet 1   No facility-administered medications prior to visit.      ROS Review of Systems  Constitutional: Negative for chills, fever and malaise/fatigue.   Eyes: Negative for blurred vision.  Respiratory: Negative for shortness of breath.   Cardiovascular: Positive for chest pain. Negative for palpitations.  Gastrointestinal: Negative for abdominal pain and nausea.  Genitourinary: Negative for dysuria and hematuria.  Musculoskeletal: Positive for back pain and joint pain. Negative for myalgias.  Skin: Negative for rash.  Neurological: Negative for tingling and headaches.  Psychiatric/Behavioral: Negative for depression. The patient is not nervous/anxious.     Objective:  There were no vitals taken for this visit.  BP/Weight 03/01/2017 02/22/2017 02/21/2017  Systolic BP 131 155 147  Diastolic BP 88 110 96  Wt. (Lbs) 263.2 - 260.6  BMI 32.9 - 32.57      Physical Exam  Constitutional: He is oriented to person, place, and time.  Well developed, well nourished, NAD, polite  HENT:  Head: Normocephalic and atraumatic.  Eyes: No scleral icterus.  Neck: Normal range of motion.  Cardiovascular: Normal rate, regular rhythm and normal heart sounds.  Pulmonary/Chest: Effort normal and breath sounds normal.  Musculoskeletal: He exhibits no edema.  Mildly to moderately limited aROM of the lower back in all planes of motion. No spasm of the lumbar paraspinals. TTP along the talus region of the right foot.   Neurological: He is alert and oriented to person, place, and time. No cranial nerve deficit. Coordination normal.  Normal gait  Skin: Skin is warm and dry. No rash  noted. No erythema. No pallor.  Psychiatric: He has a normal mood and affect. His behavior is normal. Thought content normal.  Vitals reviewed.    Assessment & Plan:    1. Chronic pain of right ankle - I have written down [facility] number on pt's AVS. He is supposed to have followed up with Dr. Lajoyce Corners to discuss possible surgery.   2. Essential hypertension - hydrochlorothiazide (HYDRODIURIL) 25 MG tablet; Take 1 tablet (25 mg total) by mouth daily. Take on tablet  in the morning.  Dispense: 90 tablet; Refill: 1  3. Chest wall pain - Seems to be resolving. I have counseled patient on the long term use of narcotics. I have refused his request for Hydrocodone refill but have written Tylenol #3 rx with his full understanding that he will no receive any more narcotic pain relief from me. I will send to pain clinic should his chest wall pain continue.  4. Chronic left-sided low back pain, with sciatica presence unspecified - DG Lumbar Spine Complete; Future   Meds ordered this encounter  Medications  . DISCONTD: hydrochlorothiazide (HYDRODIURIL) 25 MG tablet    Sig: Take 1 tablet (25 mg total) by mouth daily. Take on tablet in the morning.    Dispense:  90 tablet    Refill:  1    Order Specific Question:   Supervising Provider    Answer:   Quentin Angst [id number]  . acetaminophen-codeine (TYLENOL #3) 300-30 MG tablet    Sig: Take 1 tablet by mouth 2 (two) times daily.    Dispense:  60 tablet    Refill:  0    Order Specific Question:   Supervising Provider    Answer:   Quentin Angst [id number]  . hydrochlorothiazide (HYDRODIURIL) 25 MG tablet    Sig: Take 1 tablet (25 mg total) by mouth daily. Take on tablet in the morning.    Dispense:  90 tablet    Refill:  1    Order Specific Question:   Supervising Provider    Answer:   Quentin Angst [id number]    Follow-up: Return in about 2 months (around 06/02/2017) for back pain.   Loletta Specter PA

## 2017-04-04 NOTE — Patient Instructions (Signed)
Please call [facility] at [phone] to schedule an appointment to see Dr. Lajoyce Cornersuda to discuss possible surgery of the right ankle.

## 2017-04-11 ENCOUNTER — Ambulatory Visit (INDEPENDENT_AMBULATORY_CARE_PROVIDER_SITE_OTHER): Payer: Medicare Other | Admitting: Physician Assistant

## 2017-04-12 ENCOUNTER — Ambulatory Visit (HOSPITAL_COMMUNITY)
Admission: RE | Admit: 2017-04-12 | Discharge: 2017-04-12 | Disposition: A | Discharge status: Home / Self Care | Payer: Medicare Other | Source: Ambulatory Visit | Attending: Physician Assistant | Admitting: Physician Assistant

## 2017-04-12 DIAGNOSIS — M545 Low back pain: Secondary | ICD-10-CM | POA: Insufficient documentation

## 2017-04-12 DIAGNOSIS — M5136 Other intervertebral disc degeneration, lumbar region: Secondary | ICD-10-CM | POA: Insufficient documentation

## 2017-04-12 DIAGNOSIS — G8929 Other chronic pain: Secondary | ICD-10-CM | POA: Insufficient documentation

## 2017-04-13 ENCOUNTER — Telehealth (INDEPENDENT_AMBULATORY_CARE_PROVIDER_SITE_OTHER): Payer: Self-pay | Admitting: *Deleted

## 2017-04-13 NOTE — Telephone Encounter (Signed)
-----   Message from Loletta Specteroger David Gomez, PA-C sent at 04/13/2017  1:54 PM EST ----- Mild degenerative changes lumbar spine. No acute bony abnormality identified. Exam stable from prior exam.

## 2017-04-13 NOTE — Telephone Encounter (Signed)
Patient verified DOB Patient is aware of mild degenerative changes being noted and being stable from prior exam. Patient states his attorney will send over a request for images. No further questions at this time.

## 2017-04-17 ENCOUNTER — Telehealth (INDEPENDENT_AMBULATORY_CARE_PROVIDER_SITE_OTHER): Payer: Self-pay | Admitting: Physician Assistant

## 2017-04-17 ENCOUNTER — Telehealth (INDEPENDENT_AMBULATORY_CARE_PROVIDER_SITE_OTHER): Payer: Self-pay

## 2017-04-17 NOTE — Telephone Encounter (Signed)
Pt called requesting refill of Meloxicam. [facility] [address]. Maryjean Mornempestt S Estel Scholze, CMA

## 2017-04-17 NOTE — Telephone Encounter (Signed)
Pt, called to check on the statu of the refill for Meloxicam., he was inform that we still have pt in the clinic and usually it take 24 to 48 to get the refill an something we can do it early if we have the time that he was inform of this when he spoke with the nurse an also will call him when has been done the refill

## 2017-04-20 ENCOUNTER — Other Ambulatory Visit (INDEPENDENT_AMBULATORY_CARE_PROVIDER_SITE_OTHER): Payer: Self-pay | Admitting: Physician Assistant

## 2017-04-20 DIAGNOSIS — R0789 Other chest pain: Secondary | ICD-10-CM

## 2017-04-20 MED ORDER — MELOXICAM 7.5 MG PO TABS: ORAL_TABLET | ORAL | 1 refills | Status: DC

## 2017-04-20 NOTE — Telephone Encounter (Signed)
This has already been addressed

## 2017-04-20 NOTE — Telephone Encounter (Signed)
Refill sent   Thank you

## 2017-04-23 NOTE — Telephone Encounter (Signed)
Left patient a voicemail notifying of refill being sent. Thomas Spence, CMA

## 2017-05-18 ENCOUNTER — Other Ambulatory Visit (INDEPENDENT_AMBULATORY_CARE_PROVIDER_SITE_OTHER): Payer: Self-pay | Admitting: Physician Assistant

## 2017-05-18 DIAGNOSIS — R0789 Other chest pain: Secondary | ICD-10-CM

## 2017-05-19 ENCOUNTER — Other Ambulatory Visit (INDEPENDENT_AMBULATORY_CARE_PROVIDER_SITE_OTHER): Payer: Self-pay | Admitting: Physician Assistant

## 2017-05-19 DIAGNOSIS — G8929 Other chronic pain: Secondary | ICD-10-CM

## 2017-05-19 DIAGNOSIS — M25571 Pain in right ankle and joints of right foot: Principal | ICD-10-CM

## 2017-05-21 NOTE — Telephone Encounter (Signed)
FWD to PCP. Tempestt S Roberts, CMA  

## 2017-05-21 NOTE — Telephone Encounter (Signed)
FWD to PCP. Osamu Olguin S Ceaira Ernster, CMA  

## 2017-06-04 ENCOUNTER — Other Ambulatory Visit: Payer: Self-pay

## 2017-06-04 ENCOUNTER — Encounter (INDEPENDENT_AMBULATORY_CARE_PROVIDER_SITE_OTHER): Payer: Self-pay | Admitting: Physician Assistant

## 2017-06-04 ENCOUNTER — Ambulatory Visit (INDEPENDENT_AMBULATORY_CARE_PROVIDER_SITE_OTHER): Payer: Medicare Other | Admitting: Physician Assistant

## 2017-06-04 ENCOUNTER — Other Ambulatory Visit (INDEPENDENT_AMBULATORY_CARE_PROVIDER_SITE_OTHER): Payer: Self-pay | Admitting: Physician Assistant

## 2017-06-04 VITALS — BP 153/91 | HR 82 | Temp 98.0°F | Ht 75.0 in | Wt 258.4 lb

## 2017-06-04 DIAGNOSIS — R0789 Other chest pain: Secondary | ICD-10-CM

## 2017-06-04 DIAGNOSIS — G8929 Other chronic pain: Secondary | ICD-10-CM

## 2017-06-04 DIAGNOSIS — M545 Low back pain: Secondary | ICD-10-CM

## 2017-06-04 DIAGNOSIS — M25571 Pain in right ankle and joints of right foot: Secondary | ICD-10-CM

## 2017-06-04 MED ORDER — GABAPENTIN 300 MG PO CAPS: 900.0000 mg | ORAL_CAPSULE | Freq: Three times a day (TID) | ORAL | 3 refills | Status: DC

## 2017-06-04 MED ORDER — MELOXICAM 7.5 MG PO TABS: ORAL_TABLET | ORAL | 1 refills | Status: DC

## 2017-06-04 NOTE — Progress Notes (Signed)
Subjective:  Patient ID: Thomas Spence, male    DOB: 06/07/1960  Age: 57 y.o. MRN: [id number]  CC: back pain and neck pain  HPI Thomas RochMaurice D Garrisonis a 57 y.o.malewith a medical history of depression, GERD, osteoporosis, and neuromuscular disorder presents with back and neck pain. Had been going to the chiropractor for back and neck pain with some relief but has not been back due to transportation issues. XR Lumbar spine two months ago revealed mild degenerative changes stable to previous imaging. Gabapentin has been moderately helpful. Requests refill of Tylenol #3.     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 Pt was to return to see Dr. Lajoyce Cornersuda to discuss possible surgery but patient had never returned or heard from them. Pt has not called them to set up appointment. Says he had transportation issues but is now using [employer] [employer] to get around.     Chest wall pain is still present but to a lesser degree. Believes Meloxicam and Tylenol #3 have been helpful. Requests refill of Meloxicam and Tylenol #3.    Outpatient Medications Prior to Visit  Medication Sig Dispense Refill  . gabapentin (NEURONTIN) 300 MG capsule Take 2 capsules (600 mg total) by mouth 3 (three) times daily. 180 capsule 3  . hydrochlorothiazide (HYDRODIURIL) 25 MG tablet Take 1 tablet (25 mg total) by mouth daily. Take on tablet in the morning. 90 tablet 1  . Melatonin 5 MG TABS Take 1 tablet (5 mg total) by mouth daily. 30 tablet 2  . risperiDONE (RISPERDAL) 2 MG tablet Take 1 tablet (2 mg total) by mouth at bedtime. 30 tablet 1  . acetaminophen (TYLENOL) 500 MG tablet Take 500 mg by mouth every 6 (six) hours as needed.    Marland Kitchen. acetaminophen-codeine (TYLENOL #3) 300-30 MG tablet Take 1 tablet by mouth 2 (two) times daily. (Patient not taking: Reported on 06/04/2017) 60 tablet 0  . escitalopram (LEXAPRO) 20 MG tablet Take 1 tablet (20  mg total) by mouth daily. (Patient not taking: Reported on 06/04/2017) 90 tablet 1  . meloxicam (MOBIC) 7.5 MG tablet TAKE 1 TABLET BY MOUTH DAILY (Patient not taking: Reported on 06/04/2017) 90 tablet 1  . naproxen sodium (ALEVE) 220 MG tablet Take 220 mg by mouth daily as needed.     No facility-administered medications prior to visit.      ROS Review of Systems  Constitutional: Negative for chills, fever and malaise/fatigue.  Eyes: Negative for blurred vision.  Respiratory: Negative for shortness of breath.   Cardiovascular: Negative for chest pain and palpitations.  Gastrointestinal: Negative for abdominal pain and nausea.  Genitourinary: Negative for dysuria and hematuria.  Musculoskeletal: Positive for back pain and neck pain. Negative for joint pain and myalgias.  Skin: Negative for rash.  Neurological: Negative for tingling and headaches.  Psychiatric/Behavioral: Negative for depression. The patient is not nervous/anxious.     Objective:  BP (!) 153/91 (BP Location: Left Arm, Patient Position: Sitting, Cuff Size: Large)   Pulse 82   Temp 98 F (36.7 C) (Oral)   Ht 6\' 3"  (1.905 m)   Wt 258 lb 6.4 oz (117.2 kg)   SpO2 93%   BMI 32.30 kg/m   BP/Weight 06/04/2017 04/04/2017 03/01/2017  Systolic BP 153 132 131  Diastolic BP 91 89 88  Wt. (Lbs) 258.4 260 263.2  BMI 32.3 32.5 32.9      Physical Exam  Constitutional: He is oriented to person, place, and time.  Well developed, well nourished, NAD, polite  HENT:  Head: Normocephalic and atraumatic.  Eyes: No scleral icterus.  Neck: Normal range of motion. Neck supple. No thyromegaly present.  Cardiovascular: Normal rate, regular rhythm and normal heart sounds.  Pulmonary/Chest: Effort normal and breath sounds normal.  Abdominal: Soft. Bowel sounds are normal. There is no tenderness.  Musculoskeletal: He exhibits no edema.  TTP along the distal fibula and inferior to the medial malleolus. Lower back with moderately  limited flexion and extension 2/2 pain.  Neurological: He is alert and oriented to person, place, and time.  Skin: Skin is warm and dry. No rash noted. No erythema. No pallor.  Psychiatric: He has a normal mood and affect. His behavior is normal. Thought content normal.  Vitals reviewed.    Assessment & Plan:    1. Chronic pain of right ankle - AMB referral to orthopedics - Ambulatory referral to Pain Clinic - Increase gabapentin (NEURONTIN) 300 MG capsule; Take 3 capsules (900 mg total) by mouth 3 (three) times daily.  Dispense: 270 capsule; Refill: 3  2. Chest wall pain - Seems to be resolving but patient repeatedly asks for opioid medication. Will send to pain clinic to evaluate and treat accordingly. - Ambulatory referral to Pain Clinic - Refill meloxicam (MOBIC) 7.5 MG tablet; TAKE 1 TABLET BY MOUTH DAILY  Dispense: 30 tablet; Refill: 1  3. Chronic midline low back pain without sciatica - AMB referral to orthopedics - Ambulatory referral to Pain Clinic    Meds ordered this encounter  Medications  . gabapentin (NEURONTIN) 300 MG capsule    Sig: Take 3 capsules (900 mg total) by mouth 3 (three) times daily.    Dispense:  270 capsule    Refill:  3    Order Specific Question:   Supervising Provider    Answer:   Quentin Angst [id number]  . meloxicam (MOBIC) 7.5 MG tablet    Sig: TAKE 1 TABLET BY MOUTH DAILY    Dispense:  30 tablet    Refill:  1    **Patient requests 90 days supply**    Order Specific Question:   Supervising Provider    Answer:   Quentin Angst [[id number]]    Follow-up: Return if symptoms worsen or fail to improve, for back pain.   Loletta Specter PA

## 2017-06-04 NOTE — Telephone Encounter (Signed)
Patient requesting 90 day supply. FWD to PCP. Maryjean Mornempestt S Roberts, CMA

## 2017-06-04 NOTE — Patient Instructions (Addendum)
Community Resources  Advocacy/Legal Legal Aid [place]:  1-866-219-5262  /  336-272-0148  Family Justice Center:  336-641-7233  [facility] 24-hr Crisis line:  336-273-7273  Women's Resource Center, [place]:  336-275-6090  Court Watch (custody):  336-275-2346  [place] [facility]:   336-279-9299    Baby & Breastfeeding Car Seat Inspection @ Various [place] Fire Depts.- call 336-373-2177  [facility]  336-832-6860  [facility] 336-878-6712  WIC: 336-641-3663 ([place]);  336-641-7571 ([place])  La Leche League:  1-877-452-5321   Childcare [place] [facility]: 336-369-5097 ([place]) / 336-887-8224 ([place])  - Child Care Resources/ Referrals/ Scholarships  - Head Start/ Early Head Start (call or apply online)  [place] DHHS: [place] Pre-K :  1-800-859-0829 / 336-274-5437   Employment / Job Search [facility] [place]: 336-275-6090 / [address]  [place] [employer] (JobLink): 336-373-5922 ([place]) / 336-882-4141 ([place])  [facility] Community Resource/ Career Center: 336-275-9801 / 336-282-7307  [facility] Job & Career Center: 336-373-3764  DHHS Work First: 336-641-3447 ([place]) / 336-641-3447 ([place])  [facility] [place]:  336-676-5871   Financial Assistance [facility]:  336-553-2657  [employer]: 336-235-0368  [employer] (furniture):  336-370-4002  [place] Hel[facility] Hands: 336-373-4264  Low Income Energy Assistance  336-641-3000   Food Assistance DHHS- SNAP/ Food Stamps: 336-641-4588  WIC: [place]- 336-641-3663 ;  [place] 336-641-7571  Little Green Book- Free Meals  Little Blue Book- Free Food Pantries  During the summer, text "FOOD" to 877877   General Health / Clinics (Adults) Orange Card (for Adults) through [facility]: (336) 895-4900  [facility]:   336-832-8035  [facility]:   336-832-4444  Health Department:  336-641-3245  [facility]:  336-415-3877 / 336-641-2100  Planned [facility] of [place]:   336-373-0678  [facility]:   336-334-4822 x 50251   Housing [place] Housing Coalition:   336-691-9521  [place] Housing Authority:  336-275-8501  Affordable Housing Managemnt:  336-273-0568   Immigrant/ Refugee Center for [place]ians (UNCG):  336-256-1065  Faith Action International House:  336-379-0037  [employer] Arrivals Institute:  336-937-4701  Church World Services:  336-617-0381  African Services Coalition:  336-574-2677   LGBTQ YouthSAFE  www.youthsafe[place].org  PFLAG  336-541-6754 / info@[place].org  The Trevor Project:  1-866-488-7386   Mental Health/ Substance Use [facility]  336-387-6161  [facility]:  336-832-9700 or 1-800-711-2635  Carter's Circle of Care:  336-271-5888  [facility]:  336-294-1349  [facility] Care Services:  336-542-2884  [facility] (walk-ins)  336-676-6840 / [address]  [place]:  800-449-1287  Alcoholics Anonymous:  336-854-4278  Narcotics Anonymous:  800-365-1036  Quit Smoking Hotline:  800-QUIT-NOW (800-784-8669)   Parenting Children's Home Society:  800-632-1400  [place]: Education Center & Support Groups:  336-832-6682  YWCA: 336-273-3461  UNCG: Bringing Out the Best:  336-334-3120               Thriving at Three (Hispanic families): 336-256-1066  Healthy Start ([facility]):  336-387-6161 x2288  Parents as Teachers:  336-691-0024  [place] Child Development- Learning Together (Immigrants): 336-369-5001   Poison Control 800-222-1222  Sports & Recreation [employer] Open Doors Application: ymcanwnc.org/join/open-doors-financial-assistance/  City of [place] Recreation Centers: http://www.[place].gov/index.aspx?page=3615   Special Needs Family Support Network:  336-832-6507  Autism Society of :   336-333-0197 x1402 or x1412 /  800-785-1035  [facility]CH [place]:  336-334-5773     ARC of [place]:  (386)336-6081845 111 9927  Children's Developmental Service Agency (CDSA):  (781)622-0017(725)081-0997  [facility] (Care Coordination for Children):  620-173-4693(331)366-4642   Transportation Medicaid Transportation: 782-530-1946(579)360-0072 to apply  [place] [employer]: 747-647-0798630-781-8813 (reduced-fare bus ID to Medicaid/ Medicare/ Orange Card)  SCAT Paratransit services: Eligible riders only, call ([phone] for application   Tutoring/Mentoring Black Child Development Institute: (301) 515-1078  [facility] Brothers/ Big Sisters: 870-851-2988(618)334-3242 Manley Mason([place])  (226)886-9723564-829-2948 ([place])  ACES through child's school: 714-818-41335181131314  [employer] Achievers: contact your local Y  SHIELD Mentor Program: (870)835-8806630-687-5725    Back Pain, Adult Back pain is very common. The pain often gets better over time. The cause of back pain is usually not dangerous. Most people can learn to manage their back pain on their own. Follow these instructions at home: Watch your back pain for any changes. The following actions may help to lessen any pain you are feeling:  Stay active. Start with short walks on flat ground if you can. Try to walk farther each day.  Exercise regularly as told by your doctor. Exercise helps your back heal faster. It also helps avoid future injury by keeping your muscles strong and flexible.  Do not sit, drive, or stand in one place for more than 30 minutes.  Do not stay in bed. Resting more than 1-2 days can slow down your recovery.  Be careful when you bend or lift an object. Use good form when lifting: ? Bend at your knees. ? Keep the object close to your body. ? Do not twist.  Sleep on a firm mattress. Lie on your side, and bend your knees. If you lie on your back, put a pillow under your knees.  Take medicines only as told by your doctor.  Put ice on the injured area. ? Put ice in a plastic bag. ? Place a towel between your skin and the bag. ? Leave the ice on for 20 minutes, 2-3 times a day for the first 2-3 days. After that, you can  switch between ice and heat packs.  Avoid feeling anxious or stressed. Find good ways to deal with stress, such as exercise.  Maintain a healthy weight. Extra weight puts stress on your back.  Contact a doctor if:  You have pain that does not go away with rest or medicine.  You have worsening pain that goes down into your legs or buttocks.  You have pain that does not get better in one week.  You have pain at night.  You lose weight.  You have a fever or chills. Get help right away if:  You cannot control when you poop (bowel movement) or pee (urinate).  Your arms or legs feel weak.  Your arms or legs lose feeling (numbness).  You feel sick to your stomach (nauseous) or throw up (vomit).  You have belly (abdominal) pain.  You feel like you may pass out (faint). This information is not intended to replace advice given to you by your health care provider. Make sure you discuss any questions you have with your health care provider. Document Released: 07/19/2007 Document Revised: 07/08/2015 Document Reviewed: 06/03/2013 Elsevier Interactive Patient Education  [employer]2018 Elsevier Inc.

## 2017-06-21 ENCOUNTER — Ambulatory Visit (INDEPENDENT_AMBULATORY_CARE_PROVIDER_SITE_OTHER): Payer: Medicare Other | Admitting: Orthopaedic Surgery

## 2017-07-12 ENCOUNTER — Other Ambulatory Visit (INDEPENDENT_AMBULATORY_CARE_PROVIDER_SITE_OTHER): Payer: Self-pay | Admitting: Physician Assistant

## 2017-07-12 ENCOUNTER — Other Ambulatory Visit (INDEPENDENT_AMBULATORY_CARE_PROVIDER_SITE_OTHER): Payer: Self-pay | Admitting: Nurse Practitioner

## 2017-07-12 DIAGNOSIS — G4700 Insomnia, unspecified: Secondary | ICD-10-CM

## 2017-07-12 NOTE — Telephone Encounter (Signed)
FWD to covering provider at [facility]. Tempestt S Roberts, CMA  

## 2017-07-24 ENCOUNTER — Ambulatory Visit (INDEPENDENT_AMBULATORY_CARE_PROVIDER_SITE_OTHER): Payer: Medicare Other | Admitting: Orthopaedic Surgery

## 2017-09-03 ENCOUNTER — Ambulatory Visit (HOSPITAL_COMMUNITY)
Admission: EM | Admit: 2017-09-03 | Discharge: 2017-09-03 | Disposition: A | Discharge status: Home / Self Care | Payer: Medicare Other | Attending: Family Medicine | Admitting: Family Medicine

## 2017-09-03 ENCOUNTER — Encounter (HOSPITAL_COMMUNITY): Payer: Self-pay | Admitting: Emergency Medicine

## 2017-09-03 DIAGNOSIS — M25571 Pain in right ankle and joints of right foot: Secondary | ICD-10-CM

## 2017-09-03 DIAGNOSIS — G8929 Other chronic pain: Secondary | ICD-10-CM

## 2017-09-03 MED ORDER — ACETAMINOPHEN-CODEINE 300-60 MG PO TABS: 1.0000 | ORAL_TABLET | ORAL | 0 refills | Status: DC | PRN

## 2017-09-03 NOTE — ED Triage Notes (Signed)
PT reports right ankle pain for a year. PT has seen several providers for this.
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
## 2017-09-10 ENCOUNTER — Telehealth (INDEPENDENT_AMBULATORY_CARE_PROVIDER_SITE_OTHER): Payer: Self-pay | Admitting: Physician Assistant

## 2017-09-10 NOTE — Telephone Encounter (Signed)
Patient called requesting medication refill for meloxicam (MOBIC) 7.5 MG tablet   Patient uses  [facility] [place] - [address] [facility] [address]  Please Advice [phone]

## 2017-09-11 ENCOUNTER — Encounter (INDEPENDENT_AMBULATORY_CARE_PROVIDER_SITE_OTHER): Payer: Self-pay | Admitting: Orthopaedic Surgery

## 2017-09-11 ENCOUNTER — Ambulatory Visit (INDEPENDENT_AMBULATORY_CARE_PROVIDER_SITE_OTHER): Payer: Medicare Other | Admitting: Orthopaedic Surgery

## 2017-09-11 ENCOUNTER — Ambulatory Visit (INDEPENDENT_AMBULATORY_CARE_PROVIDER_SITE_OTHER): Payer: Self-pay

## 2017-09-11 ENCOUNTER — Other Ambulatory Visit (INDEPENDENT_AMBULATORY_CARE_PROVIDER_SITE_OTHER): Payer: Self-pay | Admitting: Physician Assistant

## 2017-09-11 DIAGNOSIS — R0789 Other chest pain: Secondary | ICD-10-CM

## 2017-09-11 DIAGNOSIS — M25571 Pain in right ankle and joints of right foot: Secondary | ICD-10-CM | POA: Diagnosis not present

## 2017-09-11 MED ORDER — PREDNISONE 10 MG (21) PO TBPK: ORAL_TABLET | ORAL | 0 refills | Status: DC

## 2017-09-11 NOTE — Progress Notes (Addendum)
Office Visit Note   Patient: Thomas Spence           Date of Birth: Apr 15, 1960           MRN: [id number] Visit Date: 09/11/2017              Requested by: Loletta SpecterGomez, Roger David, PA-C [address] [place], [place] [id number] PCP: Loletta SpecterGomez, Roger David, PA-C   Assessment & Plan: Visit Diagnoses:  1. Pain in right ankle and joints of right foot     Plan: Patient has right ankle pain.  He does have an osteochondral defect on MRI from December 2018.  However, I do not believe this accounts for all of his ankle pain.  It is possible he also has a tendinitis (mainly tibialis posterior) versus some component of a pain syndrome given his diffuse tenderness on exam.  He does currently have a pending referral to pain management from his PCP. - We discussed today possible operative and conservative therapy. - Discussed steroid injection which the patient refused today. - Will place in [facility] and provided with crutches to use as needed - We will treat with a short course of prednisone - We will have him follow-up in 4 weeks.  Total face to face encounter time was greater than 25 minutes and over half of this time was spent in counseling and/or coordination of care.   Follow-Up Instructions: Return in about 1 month (around 10/09/2017).   Orders:  Orders Placed This Encounter  Procedures  . XR Ankle Complete Right   Meds ordered this encounter  Medications  . predniSONE (STERAPRED UNI-PAK 21 TAB) 10 MG (21) TBPK tablet    Sig: Take as directed    Dispense:  21 tablet    Refill:  0      Procedures: No procedures performed   Clinical Data: No additional findings.   Subjective: Chief Complaint  Patient presents with  . Right Ankle - Pain    HPI patient is a 57 year old male who presents with chronic right ankle pain.  His pain is been an issue for about 30 years.  20 years ago he had right ankle arthroscopy for treatment of an OCD lesion.  Since that time is been  intermittently bothersome.  More recently he has had increase in his pain.  He states that on some days he is unable to tolerate weightbearing but on others he does fine.  He denies any lower external swelling, erythema, or bruising.  He denies any numbness or tingling in extremity.  He denies any overlying skin changes.  His pain is only been controlled with Tylenol with codeine.  Review of Systems See HPI given  Objective: Vital Signs: There were no vitals taken for this visit.  Physical Exam  GEN: awake, alert, NAD Pulm: respirations unlabored  Ankle: - Inspection: No obvious deformity, erythema, swelling, or ecchymosis - Palpation: Patient reports diffuse tenderness with light palpation over his entire ankle.  There is tenderness over the peroneus longus and brevis, over the anterior ankle joint, and over the tibialis posterior, as well as the Achilles tendon. - Strength: Normal strength with dorsiflexion, plantarflexion, inversion, and eversion.  Pain reported with testing in all directions - ROM: full ROM but with pain - Neuro/vasc: NV Intact      Specialty Comments:  No specialty comments available.  Imaging: Xr Ankle Complete Right  Result Date: 09/11/2017 Negative for acute findings.  Medial talar dome OCD    PMFS History:  Patient Active Problem List   Diagnosis Date Noted  . Chronic pain of right ankle 09/18/2016  . GERD (gastroesophageal reflux disease) 03/24/2013  . Anxiety state 03/24/2013  . Hypertension 10/21/2012  . Chronic pain syndrome 10/21/2012   Past Medical History:  Diagnosis Date  . Depression   . GERD (gastroesophageal reflux disease)   . Neuromuscular disorder (HCC)   . Osteoporosis     Family History  Problem Relation Age of Onset  . Multiple sclerosis Mother   . Osteoporosis Mother   . Arthritis Mother        Rheumatoid  . Hypertension Mother   . Hyperlipidemia Mother   . Diabetes Father   . Hypertension Father   . Hyperlipidemia  Father     Past Surgical History:  Procedure Laterality Date  . COLON SURGERY    . ROTATOR CUFF REPAIR     Social History   Occupational History  . Not on file  Tobacco Use  . Smoking status: Former Smoker    Packs/day: 10.00    Years: 25.00    Pack years: 250.00    Types: Cigarettes  . Smokeless tobacco: Never Used  . Tobacco comment: states he cut back to 1 cigarette/week  Substance and Sexual Activity  . Alcohol use: No  . Drug use: No  . Sexual activity: Yes

## 2017-09-11 NOTE — Telephone Encounter (Signed)
Responded by another means.

## 2017-09-11 NOTE — Telephone Encounter (Signed)
FWD to PCP. Charmion Hapke S Tavin Vernet, CMA  

## 2017-10-09 ENCOUNTER — Ambulatory Visit (INDEPENDENT_AMBULATORY_CARE_PROVIDER_SITE_OTHER): Payer: Medicare Other | Admitting: Orthopaedic Surgery

## 2017-10-09 ENCOUNTER — Encounter (INDEPENDENT_AMBULATORY_CARE_PROVIDER_SITE_OTHER): Payer: Self-pay | Admitting: Orthopaedic Surgery

## 2017-10-09 DIAGNOSIS — M25571 Pain in right ankle and joints of right foot: Secondary | ICD-10-CM | POA: Diagnosis not present

## 2017-10-09 MED ORDER — ACETAMINOPHEN-CODEINE #3 300-30 MG PO TABS: 1.0000 | ORAL_TABLET | Freq: Three times a day (TID) | ORAL | 0 refills | Status: DC | PRN

## 2017-10-09 NOTE — Progress Notes (Signed)
Office Visit Note   Patient: Thomas Spence           Date of Birth: 01-06-1961           MRN: [id number] Visit Date: 10/09/2017              Requested by: Loletta Specter, PA-C [address] [place], [place] [address] PCP: Loletta Specter, PA-C   Assessment & Plan: Visit Diagnoses:  1. 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  He previously had an MRI in December of last year.  At this point we will refer Thomas Spence to Dr. Lajoyce Corners for further evaluation and treatment possible surgery.  Follow-Up Instructions: Return for needs f/u appt with Dr. Lajoyce Corners.   Orders:  No orders of the defined types were placed in this encounter.  Meds ordered this encounter  Medications  . acetaminophen-codeine (TYLENOL #3) 300-30 MG tablet    Sig: Take 1-2 tablets by mouth every 8 (eight) hours as needed for moderate pain.    Dispense:  30 tablet    Refill:  0      Procedures: No procedures performed   Clinical Data: No additional findings.   Subjective: Chief Complaint  Patient presents with  . Right Ankle - Pain, Follow-up    Thomas Spence follows up today for continued right ankle pain.  He is ambulating with a cane and [place] around his ankle.  Denies any swelling.   Review of Systems  Constitutional: Negative.   All other systems reviewed and are negative.    Objective: Vital Signs: There were no vitals taken for this visit.  Physical Exam  Constitutional: He is oriented to person, place, and time. He appears well-developed and well-nourished.  Pulmonary/Chest: Effort normal.  Abdominal: Soft.  Neurological: He is alert and oriented to person, place, and time.  Skin: Skin is warm.  Psychiatric: He has a normal mood and affect. His behavior is normal. Judgment and thought content normal.  Nursing note and vitals reviewed.   Ortho Exam Right ankle exam is stable.   He exhibits pain in the retrocalcaneal bursa region.  He also exhibits pain across the anterior aspect of the ankle. Specialty Comments:  No specialty comments available.  Imaging: No results found.   PMFS History: Patient Active Problem List   Diagnosis Date Noted  . Chronic pain of right ankle 09/18/2016  . GERD (gastroesophageal reflux disease) 03/24/2013  . Anxiety state 03/24/2013  . Hypertension 10/21/2012  . Chronic pain syndrome 10/21/2012   Past Medical History:  Diagnosis Date  . Depression   . GERD (gastroesophageal reflux disease)   . Neuromuscular disorder (HCC)   . Osteoporosis     Family History  Problem Relation Age of Onset  . Multiple sclerosis Mother   . Osteoporosis Mother   . Arthritis Mother        Rheumatoid  . Hypertension Mother   . Hyperlipidemia Mother   . Diabetes Father   . Hypertension Father   . Hyperlipidemia Father     Past Surgical History:  Procedure Laterality Date  . COLON SURGERY    . ROTATOR CUFF REPAIR     Social History   Occupational History  . Not on file  Tobacco Use  . Smoking status: Former Smoker    Packs/day: 10.00    Years: 25.00    Pack years: 250.00  Types: Cigarettes  . Smokeless tobacco: Never Used  . Tobacco comment: states he cut back to 1 cigarette/week  Substance and Sexual Activity  . Alcohol use: No  . Drug use: No  . Sexual activity: Yes

## 2017-10-22 ENCOUNTER — Other Ambulatory Visit (INDEPENDENT_AMBULATORY_CARE_PROVIDER_SITE_OTHER): Payer: Self-pay | Admitting: Nurse Practitioner

## 2017-10-22 ENCOUNTER — Other Ambulatory Visit (INDEPENDENT_AMBULATORY_CARE_PROVIDER_SITE_OTHER): Payer: Self-pay | Admitting: Physician Assistant

## 2017-10-22 DIAGNOSIS — F411 Generalized anxiety disorder: Secondary | ICD-10-CM

## 2017-10-22 DIAGNOSIS — R0789 Other chest pain: Secondary | ICD-10-CM

## 2017-10-22 DIAGNOSIS — I10 Essential (primary) hypertension: Secondary | ICD-10-CM

## 2017-10-22 DIAGNOSIS — G4700 Insomnia, unspecified: Secondary | ICD-10-CM

## 2017-10-22 NOTE — Telephone Encounter (Signed)
FWD to PCP. [name] S [name], CMA  

## 2017-10-23 ENCOUNTER — Other Ambulatory Visit (INDEPENDENT_AMBULATORY_CARE_PROVIDER_SITE_OTHER): Payer: Medicare Other

## 2017-10-23 DIAGNOSIS — R7989 Other specified abnormal findings of blood chemistry: Secondary | ICD-10-CM

## 2017-10-23 NOTE — Progress Notes (Signed)
Labs collected by onsite [facility] phlebotomist. Ziaire Hagos S Rynell Ciotti, CMA  

## 2017-10-24 ENCOUNTER — Telehealth (INDEPENDENT_AMBULATORY_CARE_PROVIDER_SITE_OTHER): Payer: Self-pay

## 2017-10-24 ENCOUNTER — Other Ambulatory Visit (INDEPENDENT_AMBULATORY_CARE_PROVIDER_SITE_OTHER): Payer: Self-pay | Admitting: Physician Assistant

## 2017-10-24 DIAGNOSIS — I10 Essential (primary) hypertension: Secondary | ICD-10-CM

## 2017-10-24 LAB — BASIC METABOLIC PANEL
BUN/Creatinine Ratio: 8 — ABNORMAL LOW (ref 9–20)
BUN: 10 mg/dL (ref 6–24)
CHLORIDE: 103 mmol/L (ref 96–106)
CO2: 24 mmol/L (ref 20–29)
Calcium: 9.7 mg/dL (ref 8.7–10.2)
Creatinine, Ser: 1.21 mg/dL (ref 0.76–1.27)
GFR calc non Af Amer: 67 mL/min/{1.73_m2} (ref >59)
GFR, EST AFRICAN AMERICAN: 77 mL/min/{1.73_m2} (ref >59)
GLUCOSE: 88 mg/dL (ref 65–99)
POTASSIUM: 4.2 mmol/L (ref 3.5–5.2)
Sodium: 143 mmol/L (ref 134–144)

## 2017-10-24 MED ORDER — HYDROCHLOROTHIAZIDE 25 MG PO TABS: 25.0000 mg | ORAL_TABLET | Freq: Every day | ORAL | 1 refills | Status: AC

## 2017-10-24 NOTE — Telephone Encounter (Signed)
-----   Message from Loletta Specter, PA-C sent at 10/24/2017  8:44 AM EDT ----- Kidney filtration is back to normal. I will refill HCTZ now to [facility] on [address].

## 2017-10-24 NOTE — Telephone Encounter (Signed)
Patient is aware that kidney filtration is back to normal and HCTZ has been refilled and sent to [facility] on [address]. Patient advised to keep appointment on 11/19/17. Maryjean Morn, CMA

## 2017-10-29 ENCOUNTER — Ambulatory Visit (INDEPENDENT_AMBULATORY_CARE_PROVIDER_SITE_OTHER): Payer: Medicare Other | Admitting: Orthopedic Surgery

## 2017-10-29 ENCOUNTER — Encounter (INDEPENDENT_AMBULATORY_CARE_PROVIDER_SITE_OTHER): Payer: Self-pay | Admitting: Orthopedic Surgery

## 2017-10-29 DIAGNOSIS — M25571 Pain in right ankle and joints of right foot: Secondary | ICD-10-CM

## 2017-10-29 DIAGNOSIS — M25871 Other specified joint disorders, right ankle and foot: Secondary | ICD-10-CM | POA: Diagnosis not present

## 2017-10-29 NOTE — Progress Notes (Signed)
Office Visit Note   Patient: Thomas Spence           Date of Birth: Mar 21, 1960           MRN: [id number] Visit Date: 10/29/2017              Requested by: Loletta Specter, PA-C [address] [place], [place] [address] PCP: Loletta Specter, PA-C  Chief Complaint  Patient presents with  . Right Ankle - Pain, Edema, Follow-up      HPI: Patient is a 57 year old gentleman who was seen for initial evaluation for chronic right ankle pain.  Patient states about 20 years ago he had arthroscopy with Delbert Harness.  He states the pain is returned to the point where he cannot walk patient complains of global pain around the ankle.  He has had an MRI scan in November 2018 and recently has had plain radiographs.  Assessment & Plan: Visit Diagnoses:  1. Impingement of right ankle joint   2. Pain in right ankle and joints of right foot     Plan: Offered an injection patient refused an injection at this time.  Patient request arthroscopic debridement of the ankle.  Discussed that we could proceed with arthroscopy for debridement of the impingement as well as debridement of the medial talar dome osteochondral defect.  Discussed risks and benefits including infection neurovascular injury persistent pain need for additional surgery.  Patient states he understands and wished to proceed at this time.  Follow-Up Instructions: Return in about 1 week (around 11/05/2017).   Ortho Exam  Patient is alert, oriented, no adenopathy, well-dressed, normal affect, normal respiratory effort. Examination patient has an antalgic gait he walks with his foot externally rotated and a cane in the right hand.  Patient has global tenderness to palpation around the ankle with symptom magnification.  He is a good dorsalis pedis pulse he is tender to palpation while trying to feel a pulse he is tender anterior medially and anterior laterally over the ankle joint he is tender to palpation around the peroneal and  posterior tibial tendons.  There is no redness no cellulitis no effusion review of the MRI scan shows a small sub-chondral cyst of the medial talar dome with edema in the bone.  There is very small cartilage defect.  The tendons are intact.  Radiographs shows a congruent joint with no joint space collapse.  Imaging: No results found. No images are attached to the encounter.  Labs: Lab Results  Component Value Date   HGBA1C 5.6 11/11/2013     Lab Results  Component Value Date   ALBUMIN 4.3 11/11/2013   ALBUMIN 4.0 03/24/2013   ALBUMIN 3.9 10/21/2012    There is no height or weight on file to calculate BMI.  Orders:  No orders of the defined types were placed in this encounter.  No orders of the defined types were placed in this encounter.    Procedures: No procedures performed  Clinical Data: No additional findings.  ROS:  All other systems negative, except as noted in the HPI. Review of Systems  Objective: Vital Signs: There were no vitals taken for this visit.  Specialty Comments:  No specialty comments available.  PMFS History: Patient Active Problem List   Diagnosis Date Noted  . Chronic pain of right ankle 09/18/2016  . GERD (gastroesophageal reflux disease) 03/24/2013  . Anxiety state 03/24/2013  . Hypertension 10/21/2012  . Chronic pain syndrome 10/21/2012   Past Medical History:  Diagnosis Date  . Depression   . GERD (gastroesophageal reflux disease)   . Neuromuscular disorder (HCC)   . Osteoporosis     Family History  Problem Relation Age of Onset  . Multiple sclerosis Mother   . Osteoporosis Mother   . Arthritis Mother        Rheumatoid  . Hypertension Mother   . Hyperlipidemia Mother   . Diabetes Father   . Hypertension Father   . Hyperlipidemia Father     Past Surgical History:  Procedure Laterality Date  . COLON SURGERY    . ROTATOR CUFF REPAIR     Social History   Occupational History  . Not on file  Tobacco Use  .  Smoking status: Former Smoker    Packs/day: 10.00    Years: 25.00    Pack years: 250.00    Types: Cigarettes  . Smokeless tobacco: Never Used  . Tobacco comment: states he cut back to 1 cigarette/week  Substance and Sexual Activity  . Alcohol use: No  . Drug use: No  . Sexual activity: Yes

## 2017-11-19 ENCOUNTER — Other Ambulatory Visit: Payer: Self-pay

## 2017-11-19 ENCOUNTER — Ambulatory Visit (INDEPENDENT_AMBULATORY_CARE_PROVIDER_SITE_OTHER): Payer: Medicare Other | Admitting: Physician Assistant

## 2017-11-19 ENCOUNTER — Encounter (INDEPENDENT_AMBULATORY_CARE_PROVIDER_SITE_OTHER): Payer: Self-pay | Admitting: Physician Assistant

## 2017-11-19 ENCOUNTER — Other Ambulatory Visit (INDEPENDENT_AMBULATORY_CARE_PROVIDER_SITE_OTHER): Payer: Self-pay | Admitting: Physician Assistant

## 2017-11-19 VITALS — BP 143/96 | HR 68 | Temp 98.2°F | Ht 75.0 in | Wt 244.0 lb

## 2017-11-19 DIAGNOSIS — G8929 Other chronic pain: Secondary | ICD-10-CM

## 2017-11-19 DIAGNOSIS — M25571 Pain in right ankle and joints of right foot: Secondary | ICD-10-CM

## 2017-11-19 DIAGNOSIS — M545 Low back pain, unspecified: Secondary | ICD-10-CM

## 2017-11-19 DIAGNOSIS — Z23 Encounter for immunization: Secondary | ICD-10-CM | POA: Diagnosis not present

## 2017-11-19 DIAGNOSIS — Z79899 Other long term (current) drug therapy: Secondary | ICD-10-CM | POA: Diagnosis not present

## 2017-11-19 MED ORDER — GABAPENTIN 600 MG PO TABS: 1200.0000 mg | ORAL_TABLET | Freq: Three times a day (TID) | ORAL | 5 refills | Status: AC

## 2017-11-19 MED ORDER — ACETAMINOPHEN 500 MG PO TABS: 1000.0000 mg | ORAL_TABLET | Freq: Three times a day (TID) | ORAL | 2 refills | Status: AC | PRN

## 2017-11-19 MED ORDER — MELOXICAM 15 MG PO TABS: 15.0000 mg | ORAL_TABLET | Freq: Every day | ORAL | 0 refills | Status: AC

## 2017-11-19 NOTE — Patient Instructions (Signed)

## 2017-11-19 NOTE — Progress Notes (Signed)
Subjective:  Patient ID: Thomas Spence, male    DOB: 09-30-1960  Age: 57 y.o. MRN: [id number]  CC: medication refill  HPI Thomas Spence a 57 y.o.malewith a medical history of depression, GERD, osteoporosis, and neuromuscular disorder presents with lower back pain. Onset 3 days ago. Pain rated 10/10. Says he never discussed this issue with me before but records show he had L spine XR ordered by me 8 months ago which revealed mild degenerative changes. He was also referred to ortho for back pain and ankle pain. Has not had back pain addressed by ortho yet. Gabapentin has helped relieve his pain but he still hurts "all over". Does not endorse saddle paresthesia, urinary incontinence, fecal incontinence, or paralysis.     Last BP 153/91 mmHg approximately 22 weeks ago. BP 143/96 mmHg today. Reports taking HCTZ as directed. Does not endorse CP, palpitations, SOB, HA, abdominal pain, f/c/n/v, or GI/GU sxs.       Outpatient Medications Prior to Visit  Medication Sig Dispense Refill  . gabapentin (NEURONTIN) 300 MG capsule Take 3 capsules (900 mg total) by mouth 3 (three) times daily. 270 capsule 3  . hydrochlorothiazide (HYDRODIURIL) 25 MG tablet Take 1 tablet (25 mg total) by mouth daily. Take on tablet in the morning. 90 tablet 1  . meloxicam (MOBIC) 7.5 MG tablet TAKE 1 TABLET BY MOUTH DAILY 30 tablet 0  . traZODone (DESYREL) 100 MG tablet TAKE 2 TABLETS BY MOUTH EVERY NIGHT AT BEDTIME 180 tablet 0  . risperiDONE (RISPERDAL) 2 MG tablet Take 1 tablet (2 mg total) by mouth at bedtime. (Patient not taking: Reported on 11/19/2017) 30 tablet 1  . acetaminophen-codeine (TYLENOL #3) 300-30 MG tablet Take 1-2 tablets by mouth every 8 (eight) hours as needed for moderate pain. 30 tablet 0  . acetaminophen-codeine (TYLENOL/CODEINE #4) 300-60 MG tablet Take 1 tablet by mouth every 4 (four) hours as needed for pain. 30 tablet 0  . Melatonin 5 MG TABS Take 1 tablet (5 mg total) by mouth daily.  30 tablet 2  . predniSONE (STERAPRED UNI-PAK 21 TAB) 10 MG (21) TBPK tablet Take as directed 21 tablet 0   No facility-administered medications prior to visit.      ROS Review of Systems  Constitutional: Negative for chills, fever and malaise/fatigue.  Eyes: Negative for blurred vision.  Respiratory: Negative for shortness of breath.   Cardiovascular: Negative for chest pain and palpitations.  Gastrointestinal: Negative for abdominal pain and nausea.  Genitourinary: Negative for dysuria and hematuria.  Musculoskeletal: Positive for back pain and joint pain (ankle pain). Negative for myalgias.  Skin: Negative for rash.  Neurological: Negative for tingling and headaches.  Psychiatric/Behavioral: Negative for depression. The patient is not nervous/anxious.     Objective:  BP (!) 143/96 (BP Location: Left Arm, Patient Position: Sitting, Cuff Size: Large)   Pulse 68   Temp 98.2 F (36.8 C) (Oral)   Ht 6\' 3"  (1.905 m)   Wt 244 lb (110.7 kg)   SpO2 95%   BMI 30.50 kg/m   BP/Weight 11/19/2017 09/03/2017 06/04/2017  Systolic BP 143 126 153  Diastolic BP 96 88 91  Wt. (Lbs) 244 250 258.4  BMI 30.5 31.25 32.3      Physical Exam  Constitutional: He is oriented to person, place, and time.  Well developed, well nourished, NAD, using cane  HENT:  Head: Normocephalic and atraumatic.  Eyes: No scleral icterus.  Neck: Normal range of motion. Neck supple. No thyromegaly present.  Cardiovascular: Normal rate, regular rhythm and normal heart sounds.  Pulmonary/Chest: Effort normal and breath sounds normal.  Abdominal: Soft. Bowel sounds are normal. There is no tenderness.  Musculoskeletal: He exhibits no edema.  Pt unable to do range of motion testing due to pain in the lower back.    Neurological: He is alert and oriented to person, place, and time.  Skin: Skin is warm and dry. No rash noted. No erythema. No pallor.  Psychiatric: He has a normal mood and affect. His behavior is  normal. Thought content normal.  Vitals reviewed.    Assessment & Plan:    1. Chronic midline low back pain without sciatica * Reported pain and reported aROM seems out of proportion to his body language or actual range of motion, e.g. Pt dropped phone and he laterally flexed to the right from a seated position to pick up phone. He picked up the phone without difficulty and did not complain of pain until he fully sat upright again. - Increase meloxicam (MOBIC) 15 MG tablet; Take 1 tablet (15 mg total) by mouth daily.  Dispense: 30 tablet; Refill: 0 - Increase gabapentin (NEURONTIN) 600 MG tablet; Take 2 tablets (1,200 mg total) by mouth 3 (three) times daily.  Dispense: 180 tablet; Refill: 5 - Begin acetaminophen (TYLENOL) 500 MG tablet; Take 2 tablets (1,000 mg total) by mouth every 8 (eight) hours as needed for up to 14 days.  Dispense: 42 tablet; Refill: 2 - Second Ambulatory referral to Pain Clinic. Did not answer HEAG's phone calls. Does not want to go to [facility] because they are too far for him and he does not want to miss appointments. Pt wants to be sent to "Dr. Metta Clines" yet he reports Dr. Letta Moynahan unprofessional behavior. States dealing with Dr. Metta Clines would probably be better than dealing with other pain clinics that he has heard bad reviews from.   2. Need for prophylactic vaccination and inoculation against influenza - Flu Vaccine QUAD 6+ mos PF IM (Fluarix Quad PF)  3. Chronic pain of right ankle - meloxicam (MOBIC) 15 MG tablet; Take 1 tablet (15 mg total) by mouth daily.  Dispense: 30 tablet; Refill: 0 - gabapentin (NEURONTIN) 600 MG tablet; Take 2 tablets (1,200 mg total) by mouth 3 (three) times daily.  Dispense: 180 tablet; Refill: 5 - acetaminophen (TYLENOL) 500 MG tablet; Take 2 tablets (1,000 mg total) by mouth every 8 (eight) hours as needed for up to 14 days.  Dispense: 42 tablet; Refill: 2 - Ambulatory referral to Pain Clinic  4. High risk medication use - Basic Metabolic  Panel   Meds ordered this encounter  Medications  . meloxicam (MOBIC) 15 MG tablet    Sig: Take 1 tablet (15 mg total) by mouth daily.    Dispense:  30 tablet    Refill:  0    Order Specific Question:   Supervising Provider    Answer:   Hoy Register [[id number]]  . gabapentin (NEURONTIN) 600 MG tablet    Sig: Take 2 tablets (1,200 mg total) by mouth 3 (three) times daily.    Dispense:  180 tablet    Refill:  5    Order Specific Question:   Supervising Provider    Answer:   Hoy Register [[id number]]  . acetaminophen (TYLENOL) 500 MG tablet    Sig: Take 2 tablets (1,000 mg total) by mouth every 8 (eight) hours as needed for up to 14 days.    Dispense:  42 tablet  Refill:  2    Order Specific Question:   Supervising Provider    Answer:   Hoy Register [[id number]]    Follow-up: Return in about 4 months (around 03/22/2018) for HTN.   Loletta Specter PA

## 2017-11-20 ENCOUNTER — Telehealth (INDEPENDENT_AMBULATORY_CARE_PROVIDER_SITE_OTHER): Payer: Self-pay

## 2017-11-20 LAB — BASIC METABOLIC PANEL
BUN/Creatinine Ratio: 12 (ref 9–20)
BUN: 15 mg/dL (ref 6–24)
CHLORIDE: 101 mmol/L (ref 96–106)
CO2: 24 mmol/L (ref 20–29)
CREATININE: 1.24 mg/dL (ref 0.76–1.27)
Calcium: 9.8 mg/dL (ref 8.7–10.2)
GFR calc Af Amer: 75 mL/min/{1.73_m2} (ref >59)
GFR calc non Af Amer: 65 mL/min/{1.73_m2} (ref >59)
GLUCOSE: 96 mg/dL (ref 65–99)
Potassium: 3.6 mmol/L (ref 3.5–5.2)
Sodium: 141 mmol/L (ref 134–144)

## 2017-11-20 NOTE — Telephone Encounter (Signed)
Patient is aware that kidney filtration is normal. Maryjean Morn, CMA

## 2017-11-20 NOTE — Telephone Encounter (Signed)
-----   Message from Loletta Specter, PA-C sent at 11/20/2017 12:47 PM EDT ----- Normal kidney filtration.

## 2017-12-18 ENCOUNTER — Ambulatory Visit (INDEPENDENT_AMBULATORY_CARE_PROVIDER_SITE_OTHER): Payer: Medicare Other | Admitting: Physician Assistant

## 2017-12-21 ENCOUNTER — Ambulatory Visit (INDEPENDENT_AMBULATORY_CARE_PROVIDER_SITE_OTHER): Payer: Medicare Other | Admitting: Physician Assistant
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
## 2018-03-18 ENCOUNTER — Ambulatory Visit (INDEPENDENT_AMBULATORY_CARE_PROVIDER_SITE_OTHER): Payer: Medicare Other | Admitting: Physician Assistant

## 2020-06-14 ENCOUNTER — Ambulatory Visit: Payer: Medicare Other | Admitting: Podiatry

## 2022-04-13 ENCOUNTER — Other Ambulatory Visit: Payer: Self-pay

## 2022-04-13 ENCOUNTER — Emergency Department (HOSPITAL_COMMUNITY)
Admission: EM | Admit: 2022-04-13 | Discharge: 2022-04-14 | Discharge status: Against Medical Advice | Payer: 59 | Attending: Emergency Medicine | Admitting: Emergency Medicine

## 2022-04-13 ENCOUNTER — Emergency Department (HOSPITAL_COMMUNITY): Payer: 59

## 2022-04-13 DIAGNOSIS — Z1152 Encounter for screening for COVID-19: Secondary | ICD-10-CM | POA: Diagnosis not present

## 2022-04-13 DIAGNOSIS — Z5321 Procedure and treatment not carried out due to patient leaving prior to being seen by health care provider: Secondary | ICD-10-CM | POA: Diagnosis not present

## 2022-04-13 DIAGNOSIS — R059 Cough, unspecified: Secondary | ICD-10-CM | POA: Diagnosis present

## 2022-04-13 DIAGNOSIS — R0602 Shortness of breath: Secondary | ICD-10-CM | POA: Insufficient documentation

## 2022-04-13 NOTE — ED Provider Triage Note (Signed)
  Emergency Medicine Provider Triage Evaluation Note  MRN:  [id number]  Arrival date & time: 04/13/22    Medically screening exam initiated at 11:20 PM.   CC:   Covid Test   HPI:  SLAYDEN SCHUPP is a 62 y.o. year-old male presents to the ED with chief complaint of cough and SOB.  Was sick about a month ago, improved, and now is worsening again.  Says that he keeps passing on illnesses between him and his neighbors in his complex.  History provided by patient. ROS:  -As included in HPI PE:   Vitals:   04/13/22 2314  BP: (!) 164/137  Pulse: (!) 104  Resp: 18  Temp: 99.9 F (37.7 C)  SpO2: 95%    Non-toxic appearing No respiratory distress coughing MDM:  Based on signs and symptoms, URI is highest on my differential, followed by CAP. I've ordered labs and imaging in triage to expedite lab/diagnostic workup.  Patient was informed that the remainder of the evaluation will be completed by another provider, this initial triage assessment does not replace that evaluation, and the importance of remaining in the ED until their evaluation is complete.    Montine Circle, PA-C 04/13/22 [id number]

## 2022-04-13 NOTE — ED Triage Notes (Signed)
Patient requesting Covid test , reports productive cough with SOB and generalized body aches for several weeks .

## 2022-04-14 LAB — BASIC METABOLIC PANEL
Anion gap: 10 (ref 5–15)
BUN: 5 mg/dL — ABNORMAL LOW (ref 6–20)
CO2: 26 mmol/L (ref 22–32)
Calcium: 9.1 mg/dL (ref 8.9–10.3)
Chloride: 101 mmol/L (ref 98–111)
Creatinine, Ser: 1.11 mg/dL (ref 0.61–1.24)
GFR, Estimated: >60 mL/min (ref >60)
Glucose, Bld: 99 mg/dL (ref 70–99)
Potassium: 3.6 mmol/L (ref 3.5–5.1)
Sodium: 137 mmol/L (ref 135–145)

## 2022-04-14 LAB — CBC WITH DIFFERENTIAL/PLATELET
Abs Immature Granulocytes: 0.02 10*3/uL (ref 0.00–0.07)
Basophils Absolute: 0 10*3/uL (ref 0.0–0.1)
Basophils Relative: 1 %
Eosinophils Absolute: 0.1 10*3/uL (ref 0.0–0.5)
Eosinophils Relative: 1 %
HCT: 46 % (ref 39.0–52.0)
Hemoglobin: 16.3 g/dL (ref 13.0–17.0)
Immature Granulocytes: 0 %
Lymphocytes Relative: 28 %
Lymphs Abs: 2.1 10*3/uL (ref 0.7–4.0)
MCH: 31.7 pg (ref 26.0–34.0)
MCHC: 35.4 g/dL (ref 30.0–36.0)
MCV: 89.5 fL (ref 80.0–100.0)
Monocytes Absolute: 0.5 10*3/uL (ref 0.1–1.0)
Monocytes Relative: 7 %
Neutro Abs: 4.9 10*3/uL (ref 1.7–7.7)
Neutrophils Relative %: 63 %
Platelets: 222 10*3/uL (ref 150–400)
RBC: 5.14 MIL/uL (ref 4.22–5.81)
RDW: 13 % (ref 11.5–15.5)
WBC: 7.7 10*3/uL (ref 4.0–10.5)
nRBC: 0 % (ref 0.0–0.2)

## 2022-04-14 LAB — RESP PANEL BY RT-PCR (RSV, FLU A&B, COVID)  RVPGX2
Influenza A by PCR: NEGATIVE
Influenza B by PCR: NEGATIVE
Resp Syncytial Virus by PCR: NEGATIVE
SARS Coronavirus 2 by RT PCR: NEGATIVE

## 2022-04-14 NOTE — ED Notes (Signed)
Pt did not answer for room. Moved OTF.

## 2023-05-01 DIAGNOSIS — R410 Disorientation, unspecified: Secondary | ICD-10-CM | POA: Diagnosis not present

## 2023-05-01 DIAGNOSIS — R112 Nausea with vomiting, unspecified: Secondary | ICD-10-CM | POA: Diagnosis not present

## 2023-05-01 DIAGNOSIS — Z9989 Dependence on other enabling machines and devices: Secondary | ICD-10-CM | POA: Diagnosis not present

## 2023-05-01 DIAGNOSIS — S0081XA Abrasion of other part of head, initial encounter: Secondary | ICD-10-CM | POA: Diagnosis not present

## 2023-10-05 DIAGNOSIS — Z743 Need for continuous supervision: Secondary | ICD-10-CM | POA: Diagnosis not present

## 2023-10-15 DIAGNOSIS — Z743 Need for continuous supervision: Secondary | ICD-10-CM | POA: Diagnosis not present

## 2023-10-15 DIAGNOSIS — M25562 Pain in left knee: Secondary | ICD-10-CM | POA: Diagnosis not present
